# Patient Record
Sex: Female | Born: 1960 | Race: White | Hispanic: No | Marital: Married | Smoking: Never smoker
Health system: Southern US, Community
[De-identification: ages and names within clinical notes are randomized; demographics above are authoritative.]

## PROBLEM LIST (undated history)

## (undated) DIAGNOSIS — M549 Dorsalgia, unspecified: Secondary | ICD-10-CM

## (undated) DIAGNOSIS — M791 Myalgia, unspecified site: Secondary | ICD-10-CM

## (undated) DIAGNOSIS — K219 Gastro-esophageal reflux disease without esophagitis: Secondary | ICD-10-CM

## (undated) DIAGNOSIS — M609 Myositis, unspecified: Secondary | ICD-10-CM

## (undated) DIAGNOSIS — R51 Headache: Secondary | ICD-10-CM

## (undated) DIAGNOSIS — E559 Vitamin D deficiency, unspecified: Secondary | ICD-10-CM

## (undated) HISTORY — DX: Myalgia, unspecified site: M79.10

## (undated) HISTORY — PX: OTHER SURGICAL HISTORY: SHX169

## (undated) HISTORY — DX: Myositis, unspecified: M60.9

## (undated) HISTORY — PX: EYE SURGERY: SHX253

## (undated) HISTORY — PX: WISDOM TOOTH EXTRACTION: SHX21

## (undated) HISTORY — DX: Vitamin D deficiency, unspecified: E55.9

## (undated) HISTORY — DX: Gastro-esophageal reflux disease without esophagitis: K21.9

## (undated) HISTORY — DX: Dorsalgia, unspecified: M54.9

## (undated) HISTORY — PX: DILATION AND CURETTAGE OF UTERUS: SHX78

---

## 2002-04-15 ENCOUNTER — Other Ambulatory Visit: Admission: RE | Admit: 2002-04-15 | Discharge: 2002-04-15 | Payer: Self-pay | Admitting: Obstetrics and Gynecology

## 2003-06-09 ENCOUNTER — Other Ambulatory Visit: Admission: RE | Admit: 2003-06-09 | Discharge: 2003-06-09 | Payer: Self-pay | Admitting: Obstetrics and Gynecology

## 2003-07-27 ENCOUNTER — Encounter (INDEPENDENT_AMBULATORY_CARE_PROVIDER_SITE_OTHER): Payer: Self-pay

## 2003-07-27 ENCOUNTER — Ambulatory Visit (HOSPITAL_COMMUNITY): Admission: RE | Admit: 2003-07-27 | Discharge: 2003-07-27 | Payer: Self-pay | Admitting: Obstetrics and Gynecology

## 2004-06-02 ENCOUNTER — Other Ambulatory Visit: Admission: RE | Admit: 2004-06-02 | Discharge: 2004-06-02 | Payer: Self-pay | Admitting: Obstetrics and Gynecology

## 2005-06-26 ENCOUNTER — Other Ambulatory Visit: Admission: RE | Admit: 2005-06-26 | Discharge: 2005-06-26 | Payer: Self-pay | Admitting: Obstetrics and Gynecology

## 2005-09-13 ENCOUNTER — Encounter (INDEPENDENT_AMBULATORY_CARE_PROVIDER_SITE_OTHER): Payer: Self-pay | Admitting: Specialist

## 2005-09-13 ENCOUNTER — Ambulatory Visit (HOSPITAL_COMMUNITY): Admission: RE | Admit: 2005-09-13 | Discharge: 2005-09-13 | Payer: Self-pay | Admitting: Obstetrics and Gynecology

## 2005-12-06 ENCOUNTER — Encounter: Admission: RE | Admit: 2005-12-06 | Discharge: 2005-12-06 | Payer: Self-pay | Admitting: Emergency Medicine

## 2005-12-16 ENCOUNTER — Encounter: Admission: RE | Admit: 2005-12-16 | Discharge: 2005-12-16 | Payer: Self-pay | Admitting: Emergency Medicine

## 2006-02-01 ENCOUNTER — Encounter: Admission: RE | Admit: 2006-02-01 | Discharge: 2006-02-01 | Payer: Self-pay | Admitting: Neurosurgery

## 2006-03-10 IMAGING — RF DG MYELOGRAM LUMBAR
14 of 19 series · 14 of 19 positions shown · IV contrast (omnipaque)
Comparison: none

CLINICAL DATA: Left buttock and left lower extremity pain.  
 LUMBAR MYELOGRAM ([DATE] HOURS):
TECHNIQUE: The low back was prepped and draped in a sterile fashion.  Lidocaine was utilized for local anesthesia.  Under fluoroscopic guidance, a 22 gauge spinal needle was inserted into the CSF space at L2-3 via left paramedian approach.  20cc Omnipaque 180 was administered.  No complications were encountered.
TECHNIQUE: Multidetector CT imaging of the lumbar spine was performed after intrathecal injection of contrast.  Multiplanar CT image reconstructions were also generated.

[Series 1: (hospital) · 1 of 1 slices shown]
[im 1/1]
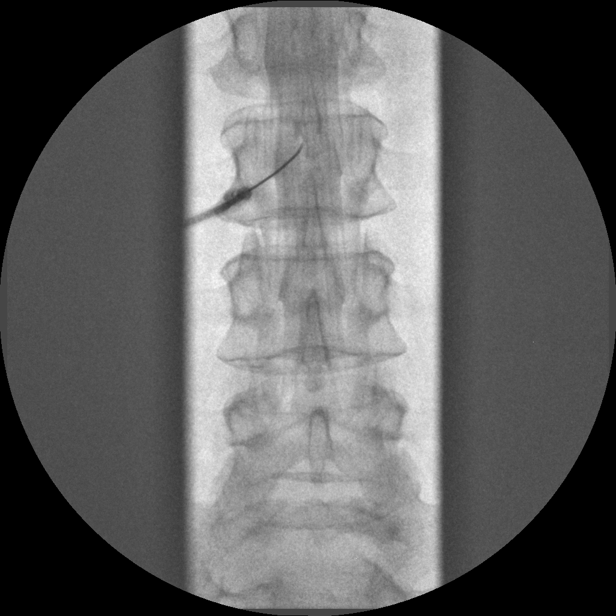

[Series 3: myelogram  white · 1 of 1 slices shown (1 of 13)]
[im 1/1]
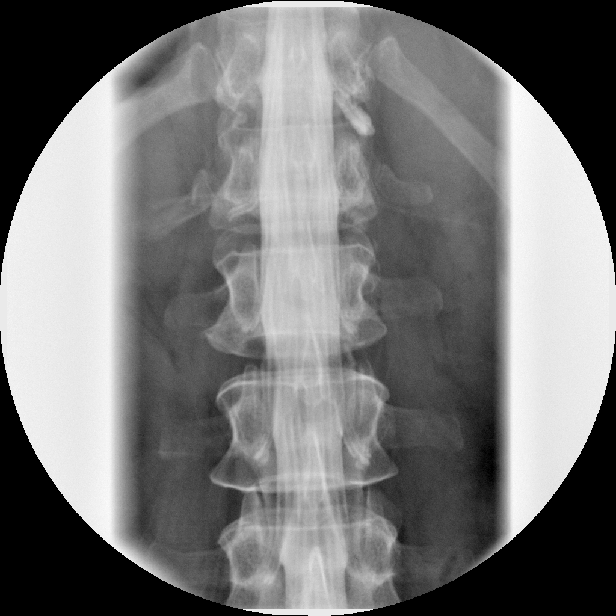

[Series 4: myelogram  white · 1 of 1 slices shown (2 of 13)]
[im 1/1]
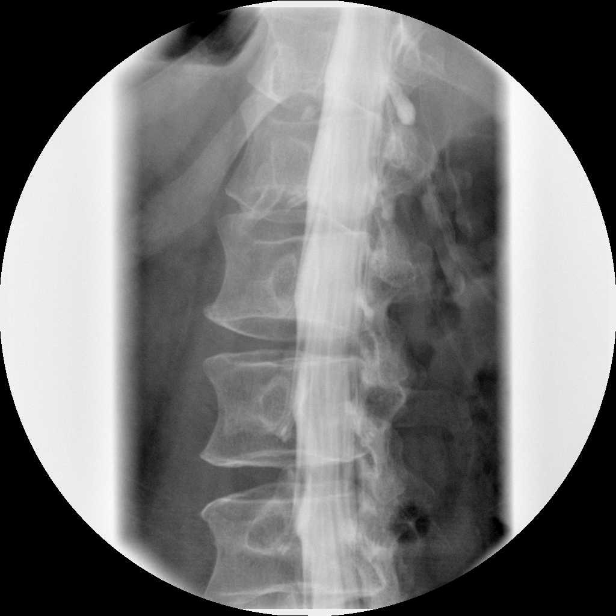

[Series 5: myelogram  white · 1 of 1 slices shown (3 of 13)]
[im 1/1]
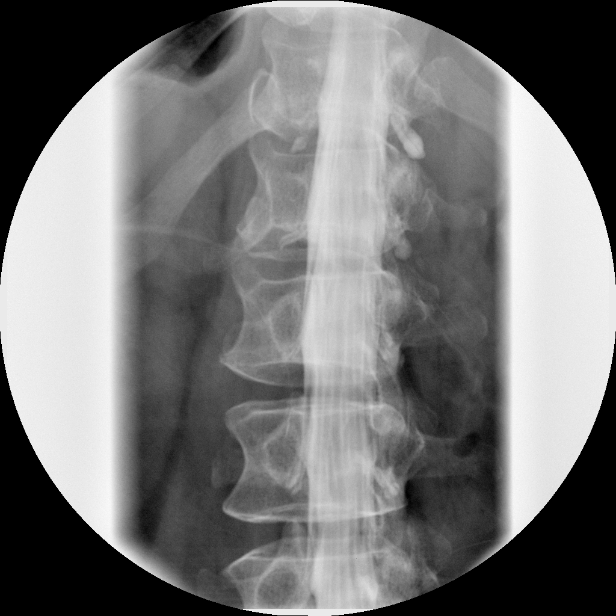

[Series 7: myelogram  white · 1 of 1 slices shown (4 of 13)]
[im 1/1]
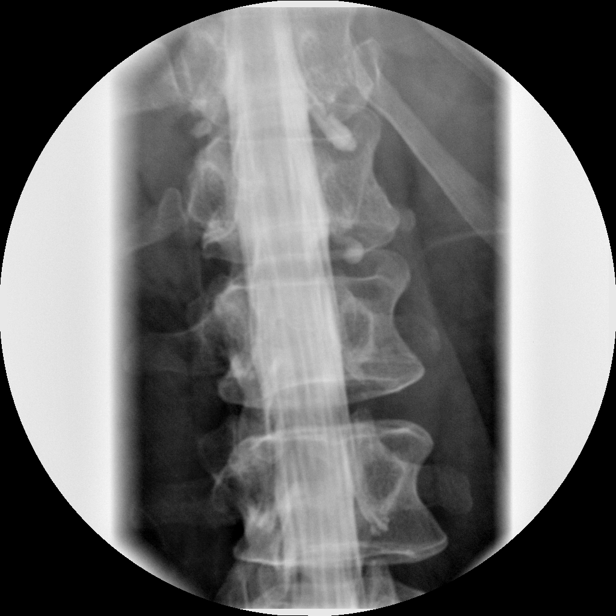

[Series 8: myelogram  white · 1 of 1 slices shown (5 of 13)]
[im 1/1]
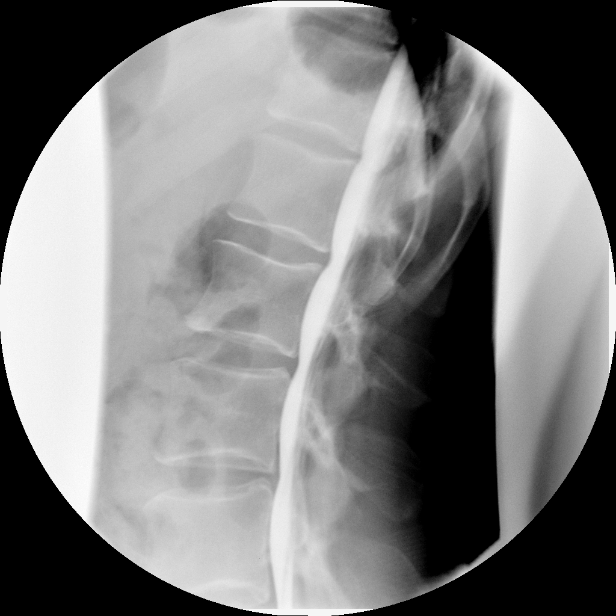

[Series 9: myelogram  white · 1 of 1 slices shown (6 of 13)]
[im 1/1]
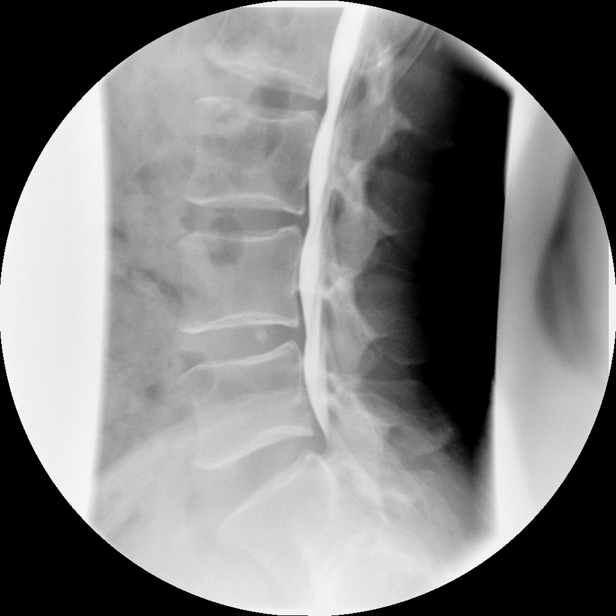

[Series 11: myelogram  white · 1 of 1 slices shown (7 of 13)]
[im 1/1]
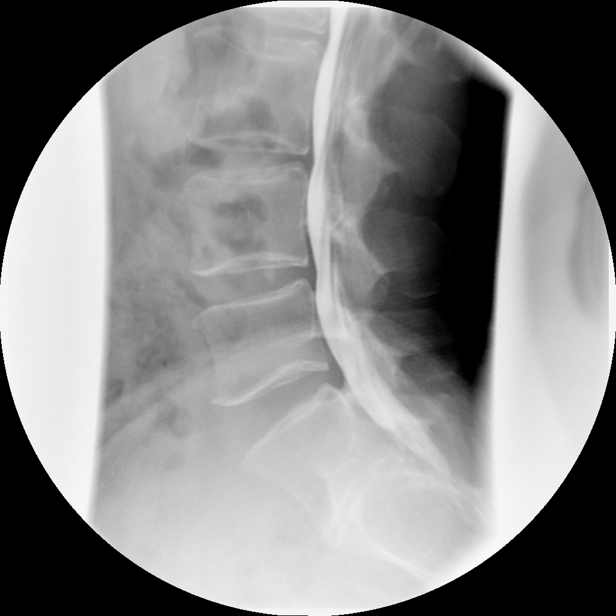

[Series 12: myelogram  white · 1 of 1 slices shown (8 of 13)]
[im 1/1]
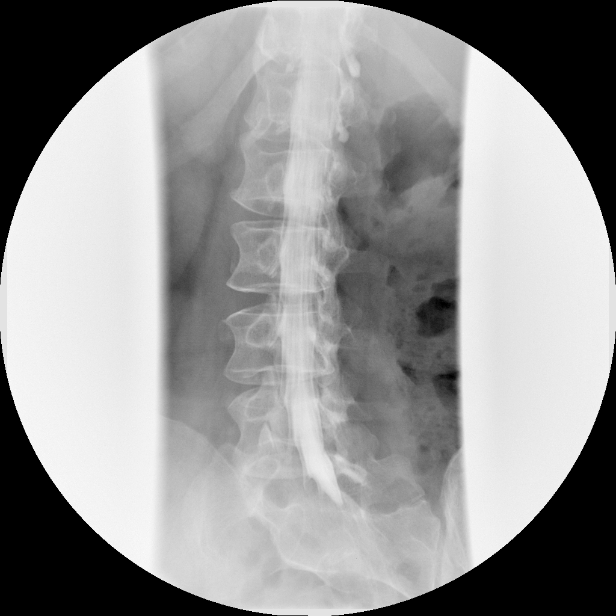

[Series 13: myelogram  white · 1 of 1 slices shown (9 of 13)]
[im 1/1]
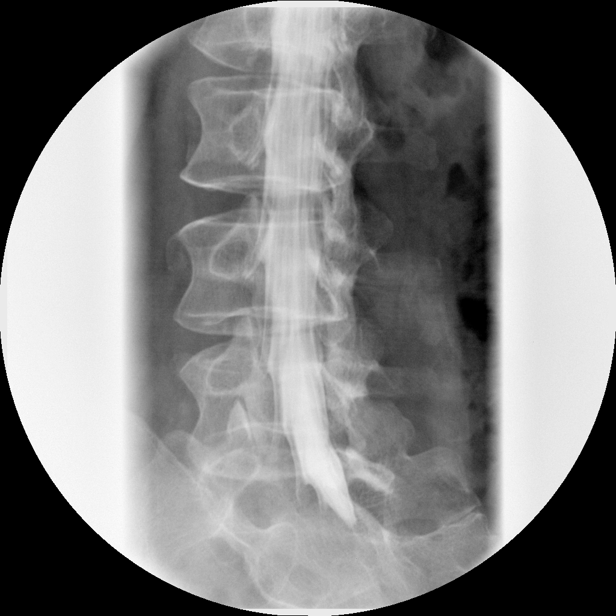

[Series 15: myelogram  white · 1 of 1 slices shown (10 of 13)]
[im 1/1]
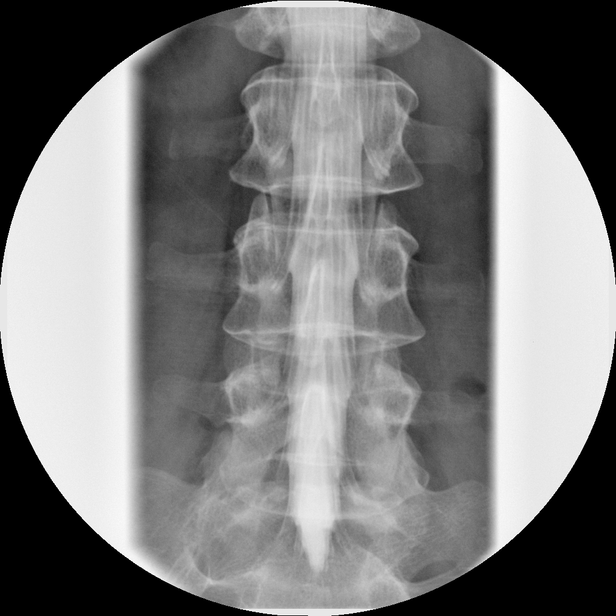

[Series 16: myelogram  white · 1 of 1 slices shown (11 of 13)]
[im 1/1]
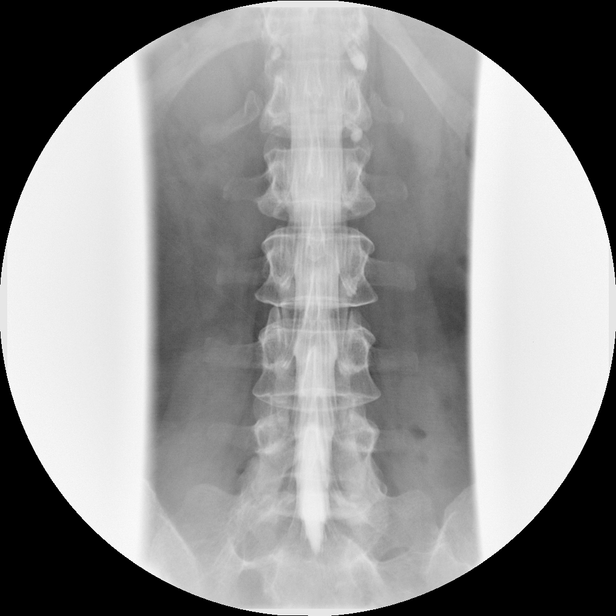

[Series 17: myelogram  white · 1 of 1 slices shown (12 of 13)]
[im 1/1]
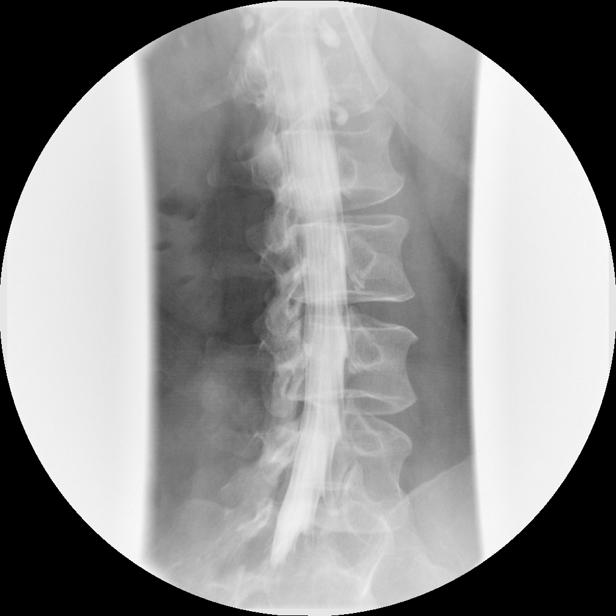

[Series 19: myelogram  white · 1 of 1 slices shown (13 of 13)]
[im 1/1]
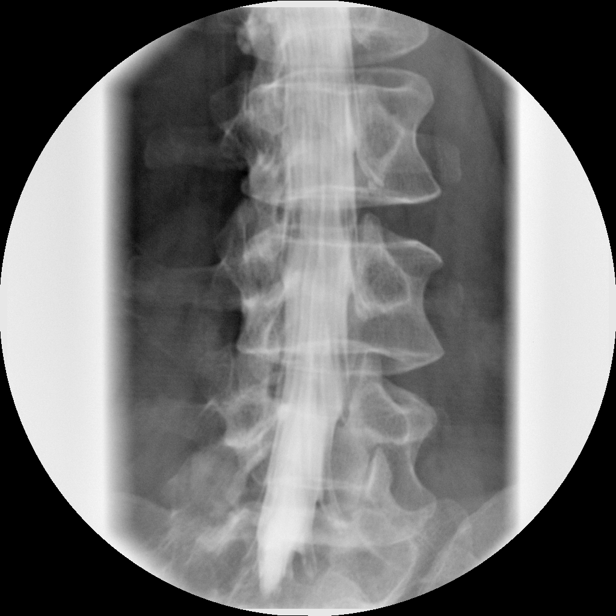

[14 of 19 positions shown; findings below may reference images not displayed]

FINDINGS: Intrathecal contrast is present.  Mild anterior extradural mass effect is present at L3-4 and L4-5.  Nerve root sleeves are contrast filled without evidence of effacement.
IMPRESSION: Lumbar myelogram in preparation for CT.  Mild extradural anterior mass effect at L3-4 and L4-5. 
 CT LUMBAR SPINE WITH CONTRAST (POST-MYELOGRAM) ([DATE] HOURS):
FINDINGS: Axial images are reconstructed in multiplanar format.  Transitional anatomy is again noted.  L-5 is sacralized and the L5-S1 disc is rudimentary.  Rudimentary ribs are present at T-12.  
 There is anatomic alignment of the vertebral bodies.  The conus medullaris terminates at the T12-L1 disc.  There is no vertebral body height loss.
 T12-L1, L1-2:  No disc herniation, canal stenosis, or foraminal stenosis.
 L2-3 and L3-4:  Mild concentric bulge without central lateral recess or foraminal stenosis.  No neural impingement.  There is a tiny bony spicule extending from the left side of the inferior L-3 end plate extending into the L3-4 disc of unknown significance. 
 L4-5:  Mild concentric bulge without central lateral recess or foraminal stenosis.  Negative neural impingement. 
 L5-S1:  No disc herniation, canal stenosis, or foraminal stenosis.  Failure of fusion of the posterior elements of L-5 is present.
IMPRESSION: 1.  Transitional anatomy is again present.  L-5 is sacralized.  The L5-S1 disc is rudimentary.  
 2.  Mild degenerative disc disease at L2-3, L3-4, and L4-5 without stenosis or neural impingement. 
 3.  Bony spicule in the L3-4 disc of unknown significance.

## 2006-03-10 IMAGING — CT CT L SPINE W/ CM
3 of 10 series · 13 of 33 positions shown, 16 images · IV contrast (omnipaque)
Comparison: none

CLINICAL DATA: Left buttock and left lower extremity pain.  
 LUMBAR MYELOGRAM ([DATE] HOURS):
TECHNIQUE: The low back was prepped and draped in a sterile fashion.  Lidocaine was utilized for local anesthesia.  Under fluoroscopic guidance, a 22 gauge spinal needle was inserted into the CSF space at L2-3 via left paramedian approach.  20cc Omnipaque 180 was administered.  No complications were encountered.
TECHNIQUE: Multidetector CT imaging of the lumbar spine was performed after intrathecal injection of contrast.  Multiplanar CT image reconstructions were also generated.

[Series 4: recon 3: l spine · axial · 0.27mm/px · z∈[-278,-135]mm · 5 of 338 slices shown, 7 images]
[im 57/338  soft-tissue]
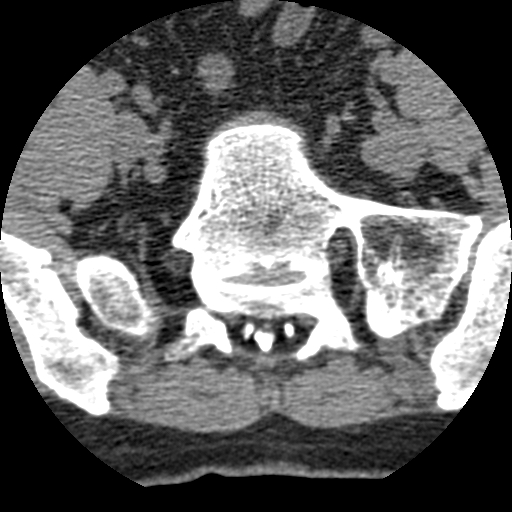
[im 57/338  bone]
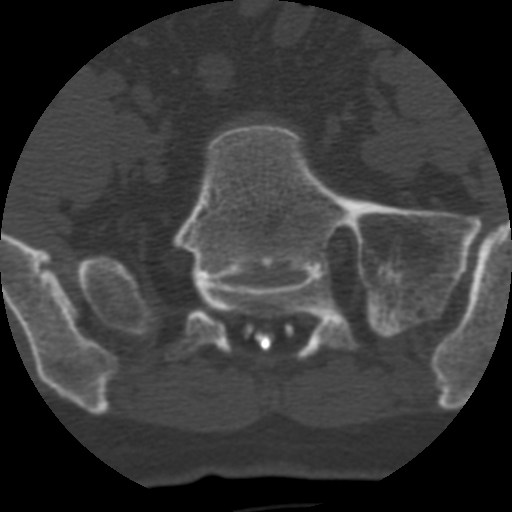
[im 113/338  bone]
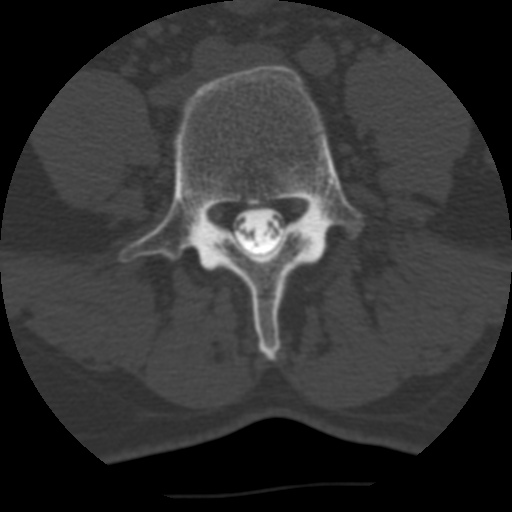
[im 169/338  bone]
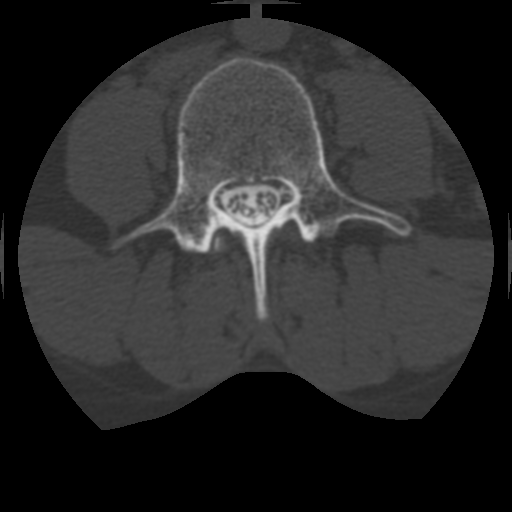
[im 225/338  bone]
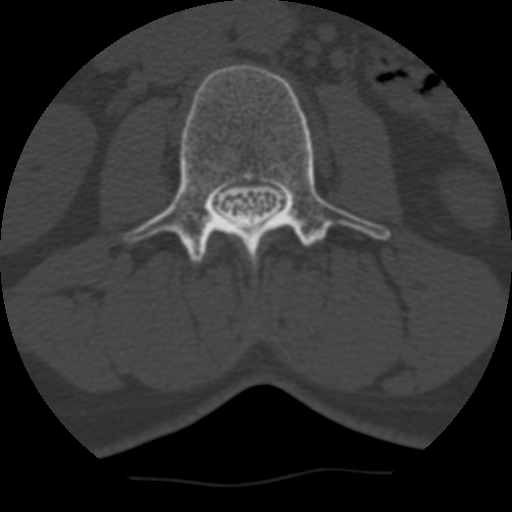
[im 281/338  soft-tissue]
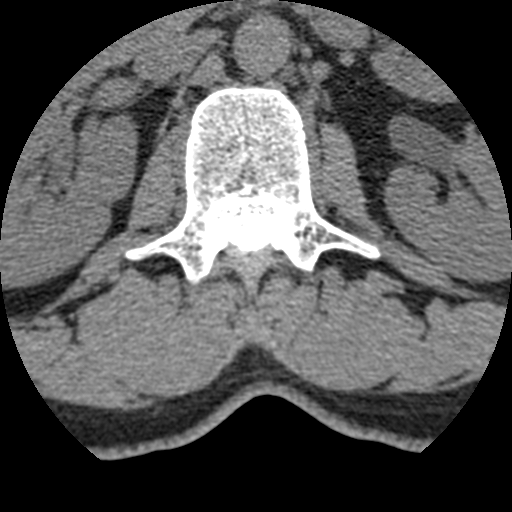
[im 281/338  bone]
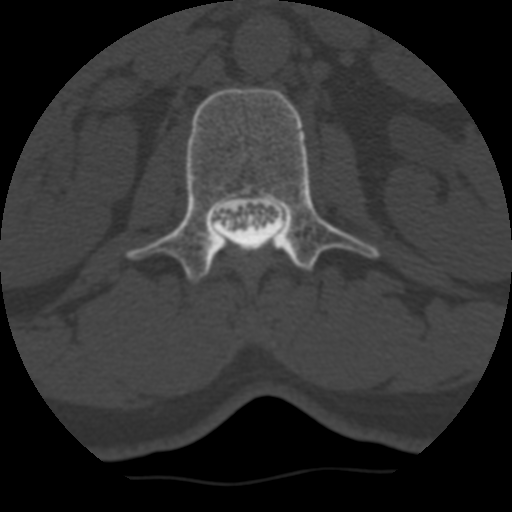

[Series 400: reformatted · sagittal · 0.42mm/px · 5 of 40 slices shown, 6 images (1 of 2)]
[im 14/40  bone]
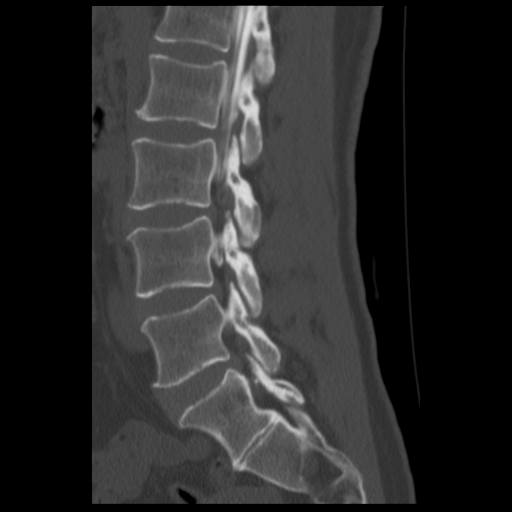
[im 17/40  bone]
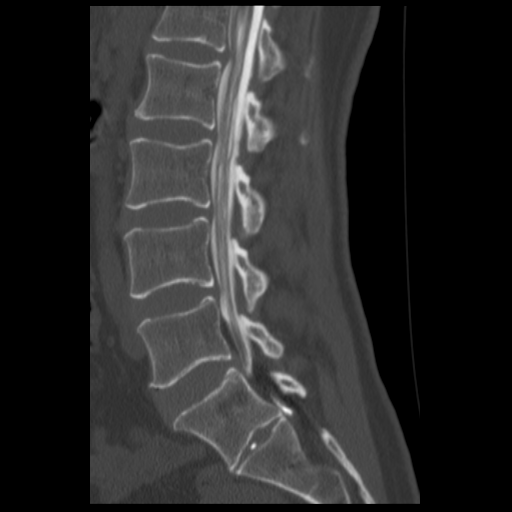
[im 20/40  soft-tissue]
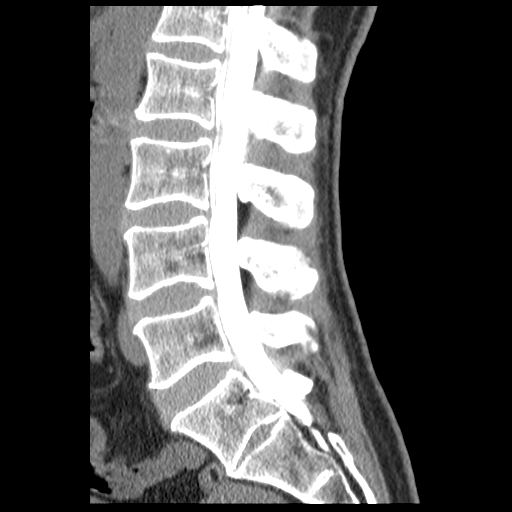
[im 20/40  bone]
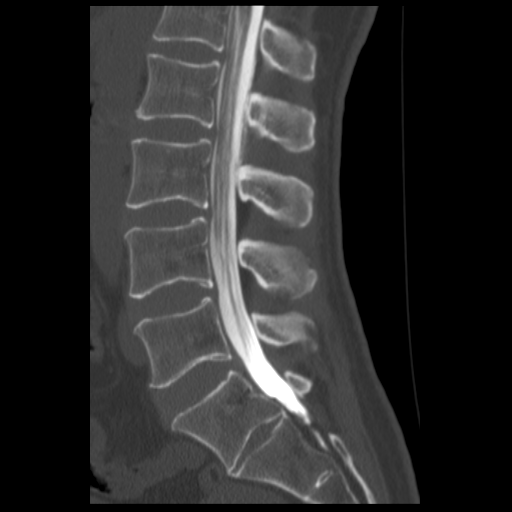
[im 23/40  bone]
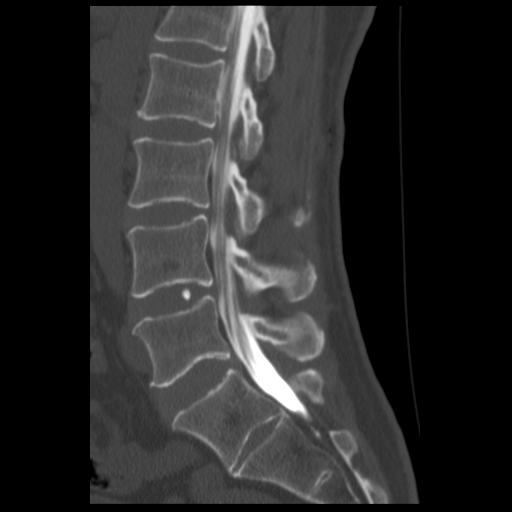
[im 27/40  bone]
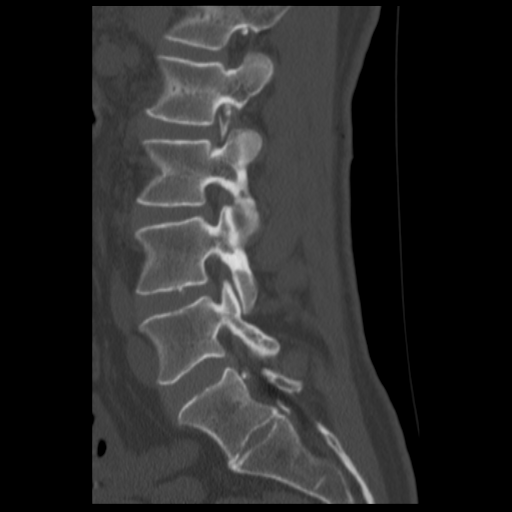

[Series 401: reformatted · coronal · 0.42mm/px · 3 of 40 slices shown (2 of 2)]
[im 8/40  bone]
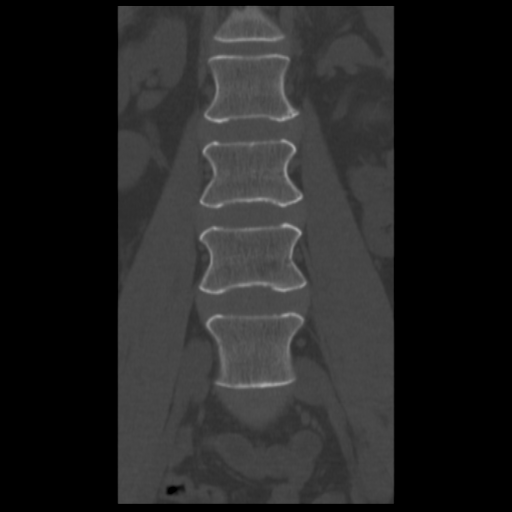
[im 16/40  bone]
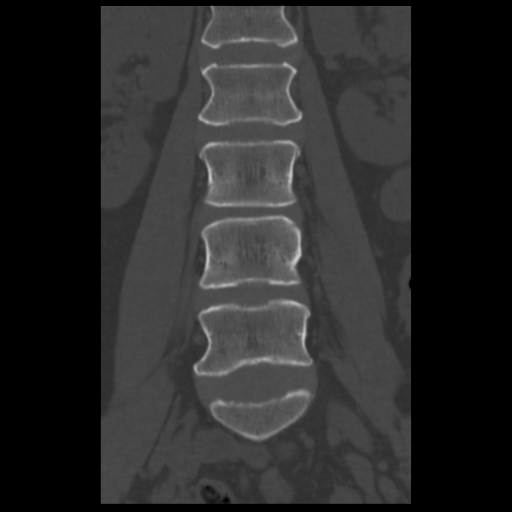
[im 24/40  bone]
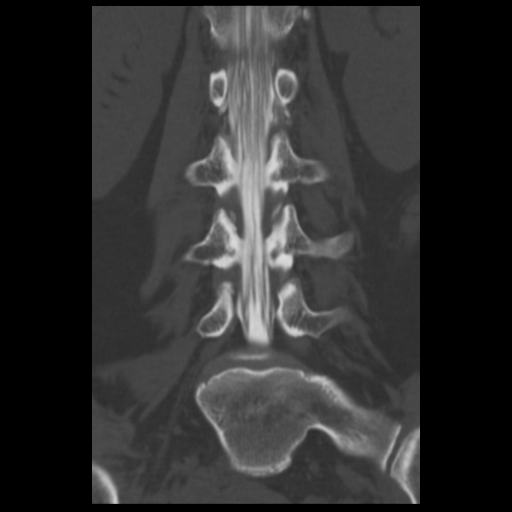

[13 of 33 positions shown; findings below may reference images not displayed]

FINDINGS: Intrathecal contrast is present.  Mild anterior extradural mass effect is present at L3-4 and L4-5.  Nerve root sleeves are contrast filled without evidence of effacement.
IMPRESSION: Lumbar myelogram in preparation for CT.  Mild extradural anterior mass effect at L3-4 and L4-5. 
 CT LUMBAR SPINE WITH CONTRAST (POST-MYELOGRAM) ([DATE] HOURS):
FINDINGS: Axial images are reconstructed in multiplanar format.  Transitional anatomy is again noted.  L-5 is sacralized and the L5-S1 disc is rudimentary.  Rudimentary ribs are present at T-12.  
 There is anatomic alignment of the vertebral bodies.  The conus medullaris terminates at the T12-L1 disc.  There is no vertebral body height loss.
 T12-L1, L1-2:  No disc herniation, canal stenosis, or foraminal stenosis.
 L2-3 and L3-4:  Mild concentric bulge without central lateral recess or foraminal stenosis.  No neural impingement.  There is a tiny bony spicule extending from the left side of the inferior L-3 end plate extending into the L3-4 disc of unknown significance. 
 L4-5:  Mild concentric bulge without central lateral recess or foraminal stenosis.  Negative neural impingement. 
 L5-S1:  No disc herniation, canal stenosis, or foraminal stenosis.  Failure of fusion of the posterior elements of L-5 is present.
IMPRESSION: 1.  Transitional anatomy is again present.  L-5 is sacralized.  The L5-S1 disc is rudimentary.  
 2.  Mild degenerative disc disease at L2-3, L3-4, and L4-5 without stenosis or neural impingement. 
 3.  Bony spicule in the L3-4 disc of unknown significance.

## 2009-10-21 ENCOUNTER — Encounter: Admission: RE | Admit: 2009-10-21 | Discharge: 2009-10-21 | Payer: Self-pay | Admitting: Sports Medicine

## 2009-10-31 ENCOUNTER — Encounter: Admission: RE | Admit: 2009-10-31 | Discharge: 2009-10-31 | Payer: Self-pay | Admitting: Sports Medicine

## 2009-11-14 ENCOUNTER — Encounter: Admission: RE | Admit: 2009-11-14 | Discharge: 2009-11-14 | Payer: Self-pay | Admitting: Sports Medicine

## 2011-04-27 NOTE — Op Note (Signed)
Kathleen Roman, Kathleen Roman             ACCOUNT NO.:  [id number]   MEDICAL RECORD NO.:  [id number]          PATIENT TYPE:  AMB   LOCATION:  [facility]                           FACILITY:  [facility]   PHYSICIAN:  Juluis Mire, M.D.   DATE OF BIRTH:  [birth year]   DATE OF PROCEDURE:  09/13/2005  DATE OF DISCHARGE:                                 OPERATIVE REPORT   PREOPERATIVE DIAGNOSIS:  Endometrial polyp.   POSTOPERATIVE DIAGNOSIS:  Endometrial polyp.   OPERATIVE PROCEDURE:  Hysteroscopy, resection of polyp, uterine curetting.   SURGEON:  Juluis Mire, M.D.   ANESTHESIA:  General.   ESTIMATED BLOOD LOSS:  Minimal.   PACKS AND DRAINS:  None.   INTRAOPERATIVE BLOOD REPLACED:  None.   COMPLICATIONS:  None.   INDICATIONS:  Dictated in the history and physical.   PROCEDURE:  The patient was taken to the OR and placed in the supine  position.  After a satisfactory level of general mask anesthesia obtained,  the patient was placed in the dorsal lithotomy position using the Allen  stirrups.  The perineum and vagina were cleansed out with Betadine and  draped in a sterile field.  A speculum was placed in the vaginal vault.  The  cervix was grasped with a single-tooth tenaculum.  The uterus sounded to 8  cm.  The cervix was serially dilated to a size 35 Pratt dilator.  Operative  hysteroscope was introduced.  Intrauterine cavity was distended using  sorbitol.  Visualization revealed a large polyp that was extending from the  uterine fundus into the uterine cavity.  This was resected in pieces.  This  was all sent for pathology.  At the end of the procedure the whole polyp had  been adequately resected.  There was no sign of perforation and no active  bleeding encountered.  Endometrial curettings were then obtained.  Single-  tooth tenaculum and speculum were then removed.  The patient was taken out  of the dorsal lithotomy position and once alert transferred to the recovery  room in good  condition.  Sponge, instrument and needle count reported as  correct by the circulating nurse.      Juluis Mire, M.D.  Electronically Signed    JSM/MEDQ  D:  09/13/2005  T:  09/13/2005  Job:  [id number]

## 2011-04-27 NOTE — H&P (Signed)
NAMECENIYA, FOWERS NO.:  [id number]   MEDICAL RECORD NO.:  [id number]          PATIENT TYPE:  AMB   LOCATION:                                FACILITY:  [facility]   PHYSICIAN:  Juluis Mire, M.D.   DATE OF BIRTH:  08/07/1961   DATE OF ADMISSION:  09/13/2005  DATE OF DISCHARGE:                                HISTORY & PHYSICAL   Patient is a 50 year old gravida 1, para 1 female who presents for a  hysteroscopic evaluation.   In relation to the present admission patient had had previous blood work  indicating menopausal status.  She had a resumption of her menstrual cycles.  A repeat FSH was not consistent with menopause.  We did a saline fusion  ultrasound that revealed a posterior wall uterine fibroid as well as a large  endometrial polyp.  She presents now for hysteroscopic evaluation and  resection of the endometrial polyp.   ALLERGIES:  Patient has no known drug allergies.   MEDICATIONS:  None.   PAST MEDICAL HISTORY:  Usual childhood diseases with no significant  sequelae.   PAST SURGICAL HISTORY:  She has had eye surgery.  She also in 2004 had a  LEEP excision of the cervix with laser ablation for management of cervical  dysplasia.   OBSTETRICAL HISTORY:  She has had one vaginal delivery.   FAMILY HISTORY:  Noncontributory.   SOCIAL HISTORY:  No tobacco or alcohol use.   REVIEW OF SYSTEMS:  Noncontributory.   PHYSICAL EXAMINATION:  VITAL SIGNS:  Patient is afebrile with stable vital  signs.  HEENT:  Patient normocephalic.  Pupils are equal, round, and reactive to  light and accommodation.  Extraocular movements were intact.  Sclerae and  conjunctivae clear.  Oropharynx clear.  NECK:  Without thyromegaly.  BREASTS:  No discrete masses.  LUNGS:  Clear.  CARDIOVASCULAR:  Regular rhythm, rate without murmurs or gallops.  ABDOMEN:  Benign.  No mass, organomegaly, or tenderness.  PELVIC:  Normal external genitalia.  Vaginal mucosa is clear.  Cervix  unremarkable.  Uterus normal size, shape, and contour.  Adnexa free of mass  or tenderness.  EXTREMITIES:  Trace edema.  NEUROLOGIC:  Grossly within normal limits.   IMPRESSION:  Perimenopausal bleeding pattern with endometrial polyp.   PLAN:  The patient will undergo hysteroscopic evaluation, resection of  polyp.  Risks of surgery have been discussed including the risk of  infection, risk of hemorrhage that could require transfusion, risk of AIDS  or hepatitis, risk of injury to adjacent organs through perforation that  could require exploratory surgery and possible hysterectomy, the risk of  deep venous thrombosis and pulmonary embolus.  Patient expressed  understanding of indications and risks.      Juluis Mire, M.D.  Electronically Signed     JSM/MEDQ  D:  09/13/2005  T:  09/13/2005  Job:  [id number]

## 2011-04-27 NOTE — H&P (Signed)
NAME:  Kathleen Roman, Kathleen Roman                       ACCOUNT NO.:  [id number]   MEDICAL RECORD NO.:  [id number]                   PATIENT TYPE:  AMB   LOCATION:  [facility]                                  FACILITY:  [facility]   PHYSICIAN:  Juluis Mire, M.D.                DATE OF BIRTH:  March 14, 1961   DATE OF ADMISSION:  07/27/2003  DATE OF DISCHARGE:                                HISTORY & PHYSICAL   HISTORY OF PRESENT ILLNESS:  The patient is a 50 year old gravida 1, para 1  married white female who presents for evaluation of abnormal cervical  cytology.   The patient had a routine gynecological exam and Pap smear performed on June 09, 2003.  Pap smear at that time revealed atypical squamous cells of  undetermined significance but could not exclude high grade dysplastic  changes.  It is of note that she had had a previous history of cervical  dysplasia in the past treated with Cryotherapy.  On colposcopy in the  office, colposcopy was not satisfactory.  There were abnormalities at the  level of the vaginal mucosa's reflexion onto the cervix posteriorly.  We did  biopsy this area and we did endocervical curettings.  Biopsy of the vaginal  area did reveal mild cervical dysplasia and endocervical curettings were  negative.  In view of the vaginal extension, the unsatisfactory colposcopy  and the possibility of high grade dysplasia by cytology, she now presents  for LEEP of the cervix which is loop electrical excision procedure as well  as laser ablation of the vaginal extension.   ALLERGIES:  CODEINE.   MEDICATIONS:  None.   PAST MEDICAL HISTORY:  Usual childhood diseases without any significant  sequelae.   OB HISTORY:  She has had one previous spontaneous vaginal delivery.   FAMILY HISTORY:  Noncontributory.   SOCIAL HISTORY:  No tobacco or alcohol use.   REVIEW OF SYSTEMS:  Noncontributory.   PHYSICAL EXAMINATION:  VITAL SIGNS:  Afebrile with stable vital signs.  HEENT:  The  patient is normocephalic.  Pupils equal, round and reactive to  light and accommodation.  Extraocular muscles intact.  Sclerae and  conjunctivae were clear.  Oropharynx clear.  NECK:  Without thyromegaly.  BREASTS:  No discrete masses.  LUNGS:  Clear.  CARDIOVASCULAR:  Regular rate without murmurs or gallops.  ABDOMEN:  Benign.  PELVIC:  Normal external genitalia.  Vaginal mucosa is clear.  Cervix  appears normal.  Uterus normal size, shape and contour.  Adnexa free of  masses or tenderness.  EXTREMITIES:  Trace edema.  NEUROLOGICAL:  Grossly within normal limits.    IMPRESSION:  1. Abnormal cervical cytology suggestive of high grade dysplasia.  2. Vaginal extension with mild dysplasia.   PLAN:  The patient will undergo loop electrical excision procedure of the  cervix and laser ablation of the vulvar area.  Risks of surgery have been  discussed including  the risk of bleeding that could require further  treatment or transfusion.  The risk of infection and the risk of damage to  the cervix that could lead to an incompetent cervix or cervical stenosis.  The patient understands the indication and risks.  She also understands the  potential for persistence or recurrence of dysplasia and need for close  follow up.                                               Juluis Mire, M.D.    JSM/MEDQ  D:  07/27/2003  T:  07/27/2003  Job:  [id number]

## 2011-04-27 NOTE — Op Note (Signed)
   NAME:  Kathleen Roman, Kathleen Roman                       ACCOUNT NO.:  [id number]   MEDICAL RECORD NO.:  [id number]                   PATIENT TYPE:  AMB   LOCATION:  [facility]                                  FACILITY:  [facility]   PHYSICIAN:  Juluis Mire, M.D.                DATE OF BIRTH:  1961-04-20   DATE OF PROCEDURE:  07/27/2003  DATE OF DISCHARGE:                                 OPERATIVE REPORT   PREOPERATIVE DIAGNOSIS:  Cervical dysplasia with vaginal extension.   POSTOPERATIVE DIAGNOSIS:  Cervical dysplasia with vaginal extension.   OPERATIVE PROCEDURE:  Loop electrical excision procedure of the cervix.  Subsequent CO2 laser ablation, vaginal extension of dysplasia.   SURGEON:  Juluis Mire, M.D.   ANESTHESIA:  General.   ESTIMATED BLOOD LOSS:  Minimal.   PACKS AND DRAINS:  None.   INTRAOPERATIVE BLOOD REPLACEMENT:  None.   COMPLICATIONS:  None.   INDICATIONS:  Dictated in the history and physical.   DESCRIPTION OF PROCEDURE:  The patient was taken to the OR and placed in  supine position.  After satisfactory level of general anesthesia obtained,  the patient was placed in the dorsal lithotomy position using the Allen  stirrups.  The patient was draped out as a sterile field with moist towels.  A laser speculum was placed in the vaginal vault, the cervix and vagina were  cleansed with Betadine.  The cervix was instilled with a solution of 1%  Xylocaine with epinephrine.  Using a white loop, a wide but shallow cone was  obtained of the cervix and sent for pathological review.   Next, using the CO2 laser at 20 watts continuous mode, the vaginal extension  of the dysplasia on the posterior vaginal wall was ablated down to  appropriate depth.  We then put Moncell's on the cervix; we had good  hemostasis.  The patient's speculum was then removed.  The patient was taken  out of the dorsal lithotomy position.  Once alert and extubated, transferred  to the recovery room in good  condition.  Sponge, needle and instrument count  were reported as correct by surgical nurse.                                               Juluis Mire, M.D.    JSM/MEDQ  D:  07/27/2003  T:  07/28/2003  Job:  [id number]

## 2011-05-14 ENCOUNTER — Emergency Department (HOSPITAL_COMMUNITY)
Admission: EM | Admit: 2011-05-14 | Discharge: 2011-05-14 | Disposition: A | Discharge status: Home / Self Care | Payer: 59 | Attending: Emergency Medicine | Admitting: Emergency Medicine

## 2011-05-14 DIAGNOSIS — Z91010 Allergy to peanuts: Secondary | ICD-10-CM | POA: Insufficient documentation

## 2011-05-14 DIAGNOSIS — T781XXA Other adverse food reactions, not elsewhere classified, initial encounter: Secondary | ICD-10-CM | POA: Insufficient documentation

## 2011-05-14 DIAGNOSIS — X58XXXA Exposure to other specified factors, initial encounter: Secondary | ICD-10-CM | POA: Insufficient documentation

## 2011-05-14 DIAGNOSIS — R6889 Other general symptoms and signs: Secondary | ICD-10-CM | POA: Insufficient documentation

## 2011-05-20 ENCOUNTER — Emergency Department (HOSPITAL_COMMUNITY)
Admission: EM | Admit: 2011-05-20 | Discharge: 2011-05-21 | Disposition: A | Discharge status: Home / Self Care | Payer: 59 | Attending: Emergency Medicine | Admitting: Emergency Medicine

## 2011-05-20 ENCOUNTER — Emergency Department (HOSPITAL_COMMUNITY): Payer: 59

## 2011-05-20 DIAGNOSIS — R079 Chest pain, unspecified: Secondary | ICD-10-CM | POA: Insufficient documentation

## 2011-05-20 DIAGNOSIS — R0602 Shortness of breath: Secondary | ICD-10-CM | POA: Insufficient documentation

## 2011-05-20 DIAGNOSIS — Z79899 Other long term (current) drug therapy: Secondary | ICD-10-CM | POA: Insufficient documentation

## 2011-05-20 LAB — COMPREHENSIVE METABOLIC PANEL
AST: 12 U/L (ref 0–37)
Albumin: 3.9 g/dL (ref 3.5–5.2)
Alkaline Phosphatase: 66 U/L (ref 39–117)
BUN: 13 mg/dL (ref 6–23)
CO2: 28 mEq/L (ref 19–32)
Chloride: 103 mEq/L (ref 96–112)
GFR calc non Af Amer: >60 mL/min (ref >60)
Potassium: 3.4 mEq/L — ABNORMAL LOW (ref 3.5–5.1)
Total Bilirubin: 0.6 mg/dL (ref 0.3–1.2)

## 2011-05-20 LAB — CK TOTAL AND CKMB (NOT AT ARMC)
Relative Index: INVALID (ref 0.0–2.5)
Relative Index: INVALID (ref 0.0–2.5)
Total CK: 54 U/L (ref 7–177)
Total CK: 47 U/L (ref 7–177)

## 2011-05-20 LAB — TROPONIN I: Troponin I: <0.3 ng/mL (ref <0.30)

## 2011-05-20 LAB — CBC
HCT: 35.9 % — ABNORMAL LOW (ref 36.0–46.0)
MCH: 31 pg (ref 26.0–34.0)
MCHC: 35.1 g/dL (ref 30.0–36.0)
MCV: 88.4 fL (ref 78.0–100.0)
RDW: 12.3 % (ref 11.5–15.5)
WBC: 8.4 10*3/uL (ref 4.0–10.5)

## 2011-05-20 LAB — D-DIMER, QUANTITATIVE: D-Dimer, Quant: <0.22 ug/mL-FEU (ref 0.00–0.48)

## 2011-05-21 ENCOUNTER — Emergency Department (HOSPITAL_COMMUNITY): Payer: 59

## 2011-05-21 MED ORDER — IOHEXOL 350 MG/ML SOLN
80.0000 mL | Freq: Once | INTRAVENOUS | Status: AC | PRN
  Administered 2011-05-21: 80 mL via INTRAVENOUS

## 2013-08-31 ENCOUNTER — Other Ambulatory Visit: Payer: Self-pay | Admitting: Internal Medicine

## 2013-08-31 DIAGNOSIS — R1013 Epigastric pain: Secondary | ICD-10-CM

## 2013-08-31 DIAGNOSIS — R110 Nausea: Secondary | ICD-10-CM

## 2013-09-01 ENCOUNTER — Ambulatory Visit
Admission: RE | Admit: 2013-09-01 | Discharge: 2013-09-01 | Disposition: A | Discharge status: Home / Self Care | Payer: 59 | Source: Ambulatory Visit | Attending: Internal Medicine | Admitting: Internal Medicine

## 2013-09-01 DIAGNOSIS — R110 Nausea: Secondary | ICD-10-CM

## 2013-09-01 DIAGNOSIS — R1013 Epigastric pain: Secondary | ICD-10-CM

## 2013-09-02 ENCOUNTER — Other Ambulatory Visit: Payer: Self-pay | Admitting: Gastroenterology

## 2013-09-02 DIAGNOSIS — R109 Unspecified abdominal pain: Secondary | ICD-10-CM

## 2013-09-02 DIAGNOSIS — R1310 Dysphagia, unspecified: Secondary | ICD-10-CM

## 2013-09-02 DIAGNOSIS — R110 Nausea: Secondary | ICD-10-CM

## 2013-09-03 ENCOUNTER — Ambulatory Visit
Admission: RE | Admit: 2013-09-03 | Discharge: 2013-09-03 | Disposition: A | Discharge status: Home / Self Care | Payer: 59 | Source: Ambulatory Visit | Attending: Gastroenterology | Admitting: Gastroenterology

## 2013-09-03 DIAGNOSIS — R1310 Dysphagia, unspecified: Secondary | ICD-10-CM

## 2013-09-03 DIAGNOSIS — R110 Nausea: Secondary | ICD-10-CM

## 2013-09-03 DIAGNOSIS — R109 Unspecified abdominal pain: Secondary | ICD-10-CM

## 2013-09-07 ENCOUNTER — Other Ambulatory Visit (HOSPITAL_COMMUNITY): Payer: Self-pay | Admitting: Gastroenterology

## 2013-09-07 DIAGNOSIS — R110 Nausea: Secondary | ICD-10-CM

## 2013-09-08 ENCOUNTER — Other Ambulatory Visit: Payer: Self-pay | Admitting: Gastroenterology

## 2013-09-14 ENCOUNTER — Other Ambulatory Visit: Payer: Self-pay | Admitting: Gastroenterology

## 2013-09-14 ENCOUNTER — Encounter (HOSPITAL_COMMUNITY): Payer: Self-pay

## 2013-09-14 ENCOUNTER — Emergency Department (HOSPITAL_COMMUNITY)
Admission: EM | Admit: 2013-09-14 | Discharge: 2013-09-15 | Disposition: A | Discharge status: Home / Self Care | Payer: 59 | Attending: Emergency Medicine | Admitting: Emergency Medicine

## 2013-09-14 DIAGNOSIS — K220 Achalasia of cardia: Secondary | ICD-10-CM | POA: Insufficient documentation

## 2013-09-14 DIAGNOSIS — R1310 Dysphagia, unspecified: Secondary | ICD-10-CM

## 2013-09-14 DIAGNOSIS — Z885 Allergy status to narcotic agent status: Secondary | ICD-10-CM | POA: Insufficient documentation

## 2013-09-14 DIAGNOSIS — M549 Dorsalgia, unspecified: Secondary | ICD-10-CM | POA: Insufficient documentation

## 2013-09-14 DIAGNOSIS — Z79899 Other long term (current) drug therapy: Secondary | ICD-10-CM | POA: Insufficient documentation

## 2013-09-14 DIAGNOSIS — R072 Precordial pain: Secondary | ICD-10-CM | POA: Insufficient documentation

## 2013-09-14 DIAGNOSIS — R110 Nausea: Secondary | ICD-10-CM | POA: Insufficient documentation

## 2013-09-14 LAB — COMPREHENSIVE METABOLIC PANEL
ALT: 14 U/L (ref 0–35)
Alkaline Phosphatase: 61 U/L (ref 39–117)
BUN: 7 mg/dL (ref 6–23)
CO2: 26 mEq/L (ref 19–32)
Calcium: 9.6 mg/dL (ref 8.4–10.5)
GFR calc Af Amer: >90 mL/min (ref >90)
GFR calc non Af Amer: >90 mL/min (ref >90)
Glucose, Bld: 113 mg/dL — ABNORMAL HIGH (ref 70–99)
Sodium: 142 mEq/L (ref 135–145)
Total Protein: 7.9 g/dL (ref 6.0–8.3)

## 2013-09-14 LAB — CBC WITH DIFFERENTIAL/PLATELET
MCHC: 34.6 g/dL (ref 30.0–36.0)
Monocytes Absolute: 0.5 10*3/uL (ref 0.1–1.0)
Neutro Abs: 3.5 10*3/uL (ref 1.7–7.7)
Neutrophils Relative %: 55 % (ref 43–77)
RDW: 12.2 % (ref 11.5–15.5)

## 2013-09-14 LAB — LIPASE, BLOOD: Lipase: 33 U/L (ref 11–59)

## 2013-09-14 NOTE — ED Notes (Signed)
Pt c/o RUQ pain starting the beginning of September, pt also reports problems swallowing feel like food get hung in her throat, pt has had an endoscopy, blood work and [employer] completed. Pt is scheduled swallow test tomorrow and gall bladder scan on Wednesday. Pt reports her pain is also in her back, unable to eat anything but is able to drink water and Gatorade, increase pain after eating

## 2013-09-15 ENCOUNTER — Ambulatory Visit
Admission: RE | Admit: 2013-09-15 | Discharge: 2013-09-15 | Disposition: A | Discharge status: Home / Self Care | Payer: 59 | Source: Ambulatory Visit | Attending: Gastroenterology | Admitting: Gastroenterology

## 2013-09-15 DIAGNOSIS — R1310 Dysphagia, unspecified: Secondary | ICD-10-CM

## 2013-09-15 LAB — POCT I-STAT TROPONIN I: Troponin i, poc: 0 ng/mL (ref 0.00–0.08)

## 2013-09-15 MED ORDER — SODIUM CHLORIDE 0.9 % IV BOLUS (SEPSIS)
1000.0000 mL | Freq: Once | INTRAVENOUS | Status: AC
  Administered 2013-09-15: 1000 mL via INTRAVENOUS

## 2013-09-15 MED ORDER — NITROGLYCERIN 0.4 MG SL SUBL: 0.4000 mg | SUBLINGUAL_TABLET | Freq: Three times a day (TID) | SUBLINGUAL | Status: DC | PRN

## 2013-09-15 NOTE — ED Notes (Signed)
MD at bedside. 

## 2013-09-15 NOTE — ED Provider Notes (Signed)
CSN: [id number]     Arrival date & time 09/14/13  2014 History   First MD Initiated Contact with Patient 09/15/13 0010     Chief Complaint  Patient presents with  . Abdominal Pain   (Consider location/radiation/quality/duration/timing/severity/associated sxs/prior Treatment) HPI Patient presents with 2 months of pain when she swallows and the sensation of food getting stuck in her throat. She has been seen by [facility] and has had multiple outpatient tests performed including endoscopy. She is scheduled for a swallow study today. She presents because of pain that she had at 6 PM when she tried to eat. The pain was substernal and radiated to her back. She states that she can drink Gatorade without any pain. She feels like she is getting dehydrated. She denies any dizziness or lightheadedness. She currently has no chest pain or shortness of breath. She denies any abdominal pain. History reviewed. No pertinent past medical history. Past Surgical History  Procedure Laterality Date  . Eye surgery     History reviewed. No pertinent family history. History  Substance Use Topics  . Smoking status: Never Smoker   . Smokeless tobacco: Not on file  . Alcohol Use: No   OB History   Grav Para Term Preterm Abortions TAB SAB Ect Mult Living                 Review of Systems  Constitutional: Negative for fever and chills.  Respiratory: Negative for cough and shortness of breath.   Cardiovascular: Positive for chest pain. Negative for palpitations and leg swelling.  Gastrointestinal: Positive for nausea and abdominal pain. Negative for vomiting, diarrhea and constipation.  Musculoskeletal: Positive for back pain. Negative for myalgias and arthralgias.  Neurological: Negative for dizziness, weakness, light-headedness, numbness and headaches.  All other systems reviewed and are negative.    Allergies  Darvocet and Peanuts  Home Medications   Current Outpatient Rx  Name  Route   Sig  Dispense  Refill  . esomeprazole (NEXIUM) 40 MG capsule   Oral   Take 40 mg by mouth daily before breakfast.         . nitroGLYCERIN (NITROSTAT) 0.4 MG SL tablet   Sublingual   Place 1 tablet (0.4 mg total) under the tongue 3 (three) times daily as needed (esophageal pain).   30 tablet   0    BP 104/64  Pulse 76  Temp(Src) 98.6 F (37 C) (Oral)  Resp 16  Ht 5\' 1"  (1.549 m)  Wt 114 lb (51.71 kg)  BMI 21.55 kg/m2  SpO2 100% Physical Exam  Nursing note and vitals reviewed. Constitutional: She is oriented to person, place, and time. She appears well-developed and well-nourished. No distress.  HENT:  Head: Normocephalic and atraumatic.  Mouth/Throat: Oropharynx is clear and moist.  Eyes: EOM are normal. Pupils are equal, round, and reactive to light.  Neck: Normal range of motion. Neck supple.  Cardiovascular: Normal rate and regular rhythm.   Pulmonary/Chest: Effort normal and breath sounds normal. No respiratory distress. She has no wheezes. She has no rales. She exhibits no tenderness.  Abdominal: Soft. Bowel sounds are normal. She exhibits no distension and no mass. There is no tenderness. There is no rebound and no guarding.  Musculoskeletal: Normal range of motion. She exhibits no edema and no tenderness.  tenderness or swelling.  Neurological: She is alert and oriented to person, place, and time.  Patient is alert and oriented x3 with clear, goal oriented speech. Patient has 5/5 motor in  all extremities. Sensation is intact to light touch.   Skin: Skin is warm and dry. No rash noted. No erythema.  Psychiatric: She has a normal mood and affect. Her behavior is normal.    ED Course  Procedures (including critical care time) Labs Review Labs Reviewed  COMPREHENSIVE METABOLIC PANEL - Abnormal; Notable for the following:    Glucose, Bld 113 (*)    All other components within normal limits  CBC WITH DIFFERENTIAL  LIPASE, BLOOD  POCT I-STAT TROPONIN I   Imaging  Review No results found.  Date: 09/15/2013  Rate: 84  Rhythm: normal sinus rhythm  QRS Axis: normal  Intervals: normal  ST/T Wave abnormalities: normal  Conduction Disutrbances:none  Narrative Interpretation:   Old EKG Reviewed: none available Occasional PVCs.  MDM   1. Achalasia of esophagus    Symptoms seem concerned concerning for achalasia/esophageal spasms. She's been encouraged to followup in the swallow study performed today. She was screen for coronary artery disease though I think is very unlikely. EKG with no acute findings normal troponin. She is feeling much better after the IV fluids. She is resting comfortably. I have written prescription for nitroglycerin. I suggested the patient has pain is unremitting that she try a sublingual nitroglycerin. Patient has been given return precautions. We'll discharge home with husband.    Loren Racer, MD 09/15/13 [phone]

## 2013-09-15 NOTE — ED Notes (Signed)
Phlebotomy called and informed we need an I-stat troponin drawn on the pt.

## 2013-09-15 NOTE — ED Notes (Signed)
Pt reports she is suppose to have a swallow test completed today.

## 2013-09-16 ENCOUNTER — Encounter (HOSPITAL_COMMUNITY)
Admission: RE | Admit: 2013-09-16 | Discharge: 2013-09-16 | Disposition: A | Discharge status: Home / Self Care | Payer: 59 | Source: Ambulatory Visit | Attending: Gastroenterology | Admitting: Gastroenterology

## 2013-09-16 DIAGNOSIS — R110 Nausea: Secondary | ICD-10-CM | POA: Insufficient documentation

## 2013-09-16 MED ORDER — SINCALIDE 5 MCG IJ SOLR
0.0200 ug/kg | Freq: Once | INTRAMUSCULAR | Status: AC
  Administered 2013-09-16: 1.04 ug via INTRAVENOUS

## 2013-09-16 MED ORDER — SINCALIDE 5 MCG IJ SOLR
INTRAMUSCULAR | Status: AC
  Administered 2013-09-16: 1.04 ug via INTRAVENOUS
  Filled 2013-09-16: qty 5

## 2013-09-16 MED ORDER — TECHNETIUM TC 99M MEBROFENIN IV KIT
5.0000 | PACK | Freq: Once | INTRAVENOUS | Status: AC | PRN
  Administered 2013-09-16: 5 via INTRAVENOUS

## 2013-09-18 ENCOUNTER — Encounter (HOSPITAL_COMMUNITY): Payer: 59

## 2013-09-19 DIAGNOSIS — Z79899 Other long term (current) drug therapy: Secondary | ICD-10-CM | POA: Insufficient documentation

## 2013-09-19 DIAGNOSIS — E860 Dehydration: Secondary | ICD-10-CM | POA: Insufficient documentation

## 2013-09-19 DIAGNOSIS — R5381 Other malaise: Secondary | ICD-10-CM | POA: Insufficient documentation

## 2013-09-19 DIAGNOSIS — R1310 Dysphagia, unspecified: Secondary | ICD-10-CM | POA: Insufficient documentation

## 2013-09-19 NOTE — ED Notes (Signed)
Pt c/o right upper abdominal pain, States she has not been able eat and she has been drinking Pedialyte.

## 2013-09-20 ENCOUNTER — Emergency Department (HOSPITAL_COMMUNITY)
Admission: EM | Admit: 2013-09-20 | Discharge: 2013-09-20 | Disposition: A | Discharge status: Home / Self Care | Payer: 59 | Attending: Emergency Medicine | Admitting: Emergency Medicine

## 2013-09-20 DIAGNOSIS — R1310 Dysphagia, unspecified: Secondary | ICD-10-CM

## 2013-09-20 DIAGNOSIS — E860 Dehydration: Secondary | ICD-10-CM

## 2013-09-20 LAB — URINALYSIS, ROUTINE W REFLEX MICROSCOPIC
Glucose, UA: NEGATIVE mg/dL
Leukocytes, UA: NEGATIVE
Protein, ur: NEGATIVE mg/dL
Urobilinogen, UA: 0.2 mg/dL (ref 0.0–1.0)
pH: 7 (ref 5.0–8.0)

## 2013-09-20 LAB — COMPREHENSIVE METABOLIC PANEL
AST: 31 U/L (ref 0–37)
Albumin: 4.2 g/dL (ref 3.5–5.2)
BUN: 4 mg/dL — ABNORMAL LOW (ref 6–23)
CO2: 26 mEq/L (ref 19–32)
Calcium: 9.3 mg/dL (ref 8.4–10.5)
Creatinine, Ser: 0.71 mg/dL (ref 0.50–1.10)
GFR calc non Af Amer: >90 mL/min (ref >90)
Sodium: 139 mEq/L (ref 135–145)
Total Protein: 7.1 g/dL (ref 6.0–8.3)

## 2013-09-20 LAB — CBC WITH DIFFERENTIAL/PLATELET
Basophils Absolute: 0 10*3/uL (ref 0.0–0.1)
Basophils Relative: 0 % (ref 0–1)
Eosinophils Absolute: 0.1 10*3/uL (ref 0.0–0.7)
Eosinophils Relative: 2 % (ref 0–5)
HCT: 37.4 % (ref 36.0–46.0)
Lymphs Abs: 1.7 10*3/uL (ref 0.7–4.0)
MCHC: 34.8 g/dL (ref 30.0–36.0)
MCV: 89.5 fL (ref 78.0–100.0)
Monocytes Absolute: 0.4 10*3/uL (ref 0.1–1.0)
Neutro Abs: 3.1 10*3/uL (ref 1.7–7.7)
Platelets: 154 10*3/uL (ref 150–400)
RBC: 4.18 MIL/uL (ref 3.87–5.11)
RDW: 12.1 % (ref 11.5–15.5)
WBC: 5.2 10*3/uL (ref 4.0–10.5)

## 2013-09-20 MED ORDER — NITROGLYCERIN 0.4 MG SL SUBL: 0.4000 mg | SUBLINGUAL_TABLET | SUBLINGUAL | Status: DC | PRN

## 2013-09-20 MED ORDER — LACTATED RINGERS IV BOLUS (SEPSIS)
1000.0000 mL | Freq: Once | INTRAVENOUS | Status: AC
  Administered 2013-09-20: 1000 mL via INTRAVENOUS

## 2013-09-20 NOTE — ED Provider Notes (Signed)
CSN: [id number]     Arrival date & time 09/19/13  2307 History   First MD Initiated Contact with Patient 09/20/13 0047     Chief Complaint  Patient presents with  . Abdominal Pain   (Consider location/radiation/quality/duration/timing/severity/associated sxs/prior Treatment) HPI Comments: Patient with no medical hx presents with difficulty swallowing. She has been seen by gastroenterology and has had multiple outpatient tests performed including endoscopy, upper GI series, Gall bladder studies - and has a f.u with Surgery and [facility] set up. She feels like she is getting dehydrated. She denies any dizziness or lightheadedness. She currently has no chest pain or shortness of breath. She denies any abdominal pain. Pt is taking pedialyte. With solids, it feels like food is getting stuck in her throat.   Patient is a 52 y.o. female presenting with abdominal pain. The history is provided by the patient, medical records and the spouse.  Abdominal Pain Associated symptoms: fatigue   Associated symptoms: no chest pain, no dysuria, no nausea, no shortness of breath and no vomiting     No past medical history on file. Past Surgical History  Procedure Laterality Date  . Eye surgery     No family history on file. History  Substance Use Topics  . Smoking status: Never Smoker   . Smokeless tobacco: Not on file  . Alcohol Use: No   OB History   Grav Para Term Preterm Abortions TAB SAB Ect Mult Living                 Review of Systems  Constitutional: Positive for activity change and fatigue.  Respiratory: Negative for shortness of breath.   Cardiovascular: Negative for chest pain.  Gastrointestinal: Positive for abdominal pain. Negative for nausea and vomiting.  Genitourinary: Negative for dysuria.  Musculoskeletal: Negative for neck pain.  Neurological: Positive for weakness. Negative for dizziness, syncope and headaches.    Allergies  Peanuts  Home Medications   Current Outpatient  Rx  Name  Route  Sig  Dispense  Refill  . esomeprazole (NEXIUM) 40 MG capsule   Oral   Take 40 mg by mouth 2 (two) times daily before a meal.          BP 105/64  Pulse 60  Temp(Src) 98.5 F (36.9 C) (Oral)  Resp 15  Ht 5\' 1"  (1.549 m)  Wt 112 lb (50.803 kg)  BMI 21.17 kg/m2  SpO2 98% Physical Exam  Nursing note and vitals reviewed. Constitutional: She is oriented to person, place, and time. She appears well-developed and well-nourished.  HENT:  Head: Normocephalic and atraumatic.  Eyes: EOM are normal. Pupils are equal, round, and reactive to light.  Neck: Neck supple.  Cardiovascular: Normal rate, regular rhythm and normal heart sounds.   No murmur heard. Pulmonary/Chest: Effort normal. No respiratory distress.  Abdominal: Soft. She exhibits no distension. There is no tenderness. There is no rebound and no guarding.  Neurological: She is alert and oriented to person, place, and time.  Skin: Skin is warm and dry.    ED Course  Procedures (including critical care time) Labs Review Labs Reviewed  COMPREHENSIVE METABOLIC PANEL - Abnormal; Notable for the following:    Glucose, Bld 104 (*)    BUN 4 (*)    ALT 47 (*)    All other components within normal limits  URINALYSIS, ROUTINE W REFLEX MICROSCOPIC - Abnormal; Notable for the following:    Ketones, ur 15 (*)    All other components within normal  limits  CBC WITH DIFFERENTIAL  LIPASE, BLOOD   Imaging Review No results found.  EKG Interpretation   None       MDM   1. Dysphagia   2. Dehydration    Pt comes in with cc of decreased po intake, + dysphagia and dehydration.  Labs indicate mild dehydration - fluids provided. She has no elye abn, and it seems like calorie wise, she is able to keep up with metabolic needs. Pt has outpatient work up that is on going, and is appropriate. Neuro-muscular condition possible as well - there is no hx of recent infection, or NM disorders in the family.  I think she is  getting appropriate workup for now, and we will not add neuro. Pt stable for discharge.   Derwood Kaplan, MD 09/20/13 [phone]

## 2013-09-20 NOTE — ED Notes (Signed)
Pt made aware a urine sample is needed. She is unable to go at this time.

## 2013-09-20 NOTE — ED Notes (Signed)
MD at bedside. Currently assessing patient. vss no c/os of pain

## 2013-09-21 ENCOUNTER — Encounter (INDEPENDENT_AMBULATORY_CARE_PROVIDER_SITE_OTHER): Payer: Self-pay | Admitting: Surgery

## 2013-09-21 ENCOUNTER — Ambulatory Visit (INDEPENDENT_AMBULATORY_CARE_PROVIDER_SITE_OTHER): Payer: 59 | Admitting: Surgery

## 2013-09-21 ENCOUNTER — Encounter (HOSPITAL_COMMUNITY): Payer: Self-pay | Admitting: Pharmacy Technician

## 2013-09-21 VITALS — BP 102/80 | HR 68 | Temp 99.1°F | Resp 14 | Ht 61.25 in | Wt 111.8 lb

## 2013-09-21 DIAGNOSIS — K828 Other specified diseases of gallbladder: Secondary | ICD-10-CM | POA: Insufficient documentation

## 2013-09-21 MED ORDER — ONDANSETRON HCL 4 MG/5ML PO SOLN: 4.0000 mg | Freq: Two times a day (BID) | ORAL | Status: DC | PRN

## 2013-09-21 NOTE — Addendum Note (Signed)
Addended by: Abigail Miyamoto A on: 09/21/2013 10:16 AM   Modules accepted: Orders

## 2013-09-21 NOTE — Progress Notes (Signed)
Patient ID: CLEMENTINE SOULLIERE, female   DOB: 1961-12-03, 52 y.o.   MRN: [id number]  Chief Complaint  Patient presents with  . New Evaluation    eval abnormal hida scan     HPI ROBERTO HLAVATY is a 52 y.o. female.   HPI This is a 52 year old female referred by Dr.Ganem and Outlaw for evaluation of biliary dyskinesia. She has been having significant gastrointestinal problems for the last 4 weeks. Her biggest complaint is difficulty swallowing and a sensation of food is held up. She will also have some nausea and emesis from time to time. She relates this to everything she needs. There is some mild discomfort in the epigastric area. She has had an extensive workup including esophageal study, an upper GI series, upper endoscopy, ultrasound, and HIDA scan.  She feels that she is dehydrated because of her inability to keep anything down. Past Medical History  Diagnosis Date  . Vitamin D deficiency disease   . Back pain   . Myalgia   . Myositis   . GERD (gastroesophageal reflux disease)     Past Surgical History  Procedure Laterality Date  . Eye surgery    . Colonscopy      History reviewed. No pertinent family history.  Social History History  Substance Use Topics  . Smoking status: Never Smoker   . Smokeless tobacco: Never Used  . Alcohol Use: No    Allergies  Allergen Reactions  . Darvocet [Propoxyphene-Acetaminophen]     hallucinations  . Hydrocodone-Acetaminophen   . Peanuts [Nuts] Other (See Comments)    All nuts. Shortness of breath    Current Outpatient Prescriptions  Medication Sig Dispense Refill  . EPINEPHrine (EPIPEN IJ) Inject as directed.      Marland Kitchen esomeprazole (NEXIUM) 40 MG capsule Take 40 mg by mouth 2 (two) times daily before a meal.      . cholecalciferol (VITAMIN D) 1000 UNITS tablet Take 1,000 Units by mouth daily.      Marland Kitchen guaiFENesin (MUCINEX) 600 MG 12 hr tablet Take 1,200 mg by mouth 2 (two) times daily.      . mometasone (NASONEX) 50 MCG/ACT nasal  spray Place 2 sprays into the nose daily.      . promethazine (PHENERGAN) 12.5 MG tablet        No current facility-administered medications for this visit.    Review of Systems Review of Systems  Constitutional: Negative for fever, chills and unexpected weight change.  HENT: Negative for congestion, hearing loss, sore throat, trouble swallowing and voice change.   Eyes: Negative for visual disturbance.  Respiratory: Negative for cough and wheezing.   Cardiovascular: Negative for chest pain, palpitations and leg swelling.  Gastrointestinal: Positive for nausea, vomiting and abdominal pain. Negative for diarrhea, constipation, blood in stool, abdominal distention and anal bleeding.  Genitourinary: Negative for hematuria, vaginal bleeding and difficulty urinating.  Musculoskeletal: Negative for arthralgias.  Skin: Negative for rash and wound.  Neurological: Negative for seizures, syncope and headaches.  Hematological: Negative for adenopathy. Does not bruise/bleed easily.  Psychiatric/Behavioral: Negative for confusion.    Blood pressure 102/80, pulse 68, temperature 99.1 F (37.3 C), temperature source Temporal, resp. rate 14, height 5' 1.25" (1.556 m), weight 111 lb 12.8 oz (50.712 kg).  Physical Exam Physical Exam  Constitutional: She is oriented to person, place, and time. She appears well-developed and well-nourished. No distress.  HENT:  Head: Normocephalic and atraumatic.  Right Ear: External ear normal.  Left Ear: External ear normal.  Nose: Nose normal.  Mouth/Throat: Oropharynx is clear and moist. No oropharyngeal exudate.  Eyes: Conjunctivae are normal. Pupils are equal, round, and reactive to light. Right eye exhibits no discharge. Left eye exhibits no discharge. No scleral icterus.  Neck: Neck supple. No tracheal deviation present.  Cardiovascular: Normal rate, regular rhythm, normal heart sounds and intact distal pulses.   No murmur heard. Pulmonary/Chest: Effort  normal and breath sounds normal. No respiratory distress. She has no wheezes.  Abdominal: Soft. Bowel sounds are normal. There is no tenderness. There is no rebound and no guarding.  Musculoskeletal: Normal range of motion. She exhibits no edema and no tenderness.  Lymphadenopathy:    She has no cervical adenopathy.  Neurological: She is alert and oriented to person, place, and time.  Skin: Skin is warm and dry. No rash noted. She is not diaphoretic. No erythema.  Psychiatric: Her behavior is normal.    Data Reviewed I have reviewed all her studies. Her ultrasound shows gallbladder crystals. Her HIDA scan shows a 5% gallbladder ejection fraction  Assessment    Biliary dyskinesia     Plan    I had a long discussion with the patient and her husband. She certainly has an indication to take out her gallbladder via a laparoscopic cholecystectomy given the results. It is difficult to tell whether this will resolve the esophageal symptoms. However, no other source of her symptoms can be identified despite the thorough workup. I discussed the surgery with her in detail. I discussed the risks which includes but is not limited to bleeding, infection, bile duct injury, bile leak, the need to convert to an open procedure, and the real possibility that this may not resolve her symptoms. I also discussed postoperative recovery and what to expect. After a long discussion, she wishes to proceed with a laparoscopic cholecystectomy. Surgery will be scheduled urgently        Parley Pidcock A 09/21/2013, 10:07 AM

## 2013-09-23 ENCOUNTER — Encounter (HOSPITAL_COMMUNITY)
Admission: RE | Admit: 2013-09-23 | Discharge: 2013-09-23 | Disposition: A | Discharge status: Home / Self Care | Payer: 59 | Source: Ambulatory Visit | Attending: Surgery | Admitting: Surgery

## 2013-09-23 ENCOUNTER — Encounter (HOSPITAL_COMMUNITY): Payer: Self-pay

## 2013-09-23 HISTORY — DX: Headache: R51

## 2013-09-23 MED ORDER — CEFAZOLIN SODIUM-DEXTROSE 2-3 GM-% IV SOLR
2.0000 g | INTRAVENOUS | Status: AC
  Administered 2013-09-24: 2 g via INTRAVENOUS
  Filled 2013-09-23: qty 50

## 2013-09-23 NOTE — Pre-Procedure Instructions (Signed)
Kathleen Roman  09/23/2013   Your procedure is scheduled on: Thursday, September 24, 2013  Report to [facility] Short Stay (use Main Entrance "A'') at 9:00 AM.  Call this number if you have problems the morning of surgery: [phone]   Remember:   Do not eat food or drink liquids after midnight.   Take these medicines the morning of surgery with A SIP OF WATER: esomeprazole (NEXIUM) 40 MG capsule, if needed: ondansetron (ZOFRAN) 4 MG/5ML solution for nausea, EPINEPHrine (EPIPEN IJ)   Do not wear jewelry, make-up or nail polish.  Do not wear lotions, powders, or perfumes. You may NOT wear deodorant.  Do not shave 48 hours prior to surgery.  Do not bring valuables to the hospital.  [facility] is not responsible for any belongings or valuables.               Contacts, dentures or bridgework may not be worn into surgery.  Leave suitcase in the car. After surgery it may be brought to your room.  For patients admitted to the hospital, discharge time is determined by your  treatment team.               Patients discharged the day of surgery will not be allowed to drive home.  Name and phone number of your driver:   Special Instructions: Shower using CHG 2 nights before surgery and the night before surgery.  If you shower the day of surgery use CHG.  Use special wash - you have one bottle of CHG for all showers.  You should use approximately 1/3 of the bottle for each shower.   Please read over the following fact sheets that you were given: Pain Booklet, Coughing and Deep Breathing and Surgical Site Infection Prevention

## 2013-09-23 NOTE — Progress Notes (Signed)
Pt denies SOB, chest pain, and being under the care of a cardiologist. Pt denies having a stress test, echo, and cardiac cath. 

## 2013-09-24 ENCOUNTER — Ambulatory Visit (HOSPITAL_COMMUNITY): Payer: 59 | Admitting: Anesthesiology

## 2013-09-24 ENCOUNTER — Encounter (HOSPITAL_COMMUNITY)
Admission: RE | Disposition: A | Discharge status: Home / Self Care | Payer: Self-pay | Source: Ambulatory Visit | Attending: Surgery

## 2013-09-24 ENCOUNTER — Ambulatory Visit (HOSPITAL_COMMUNITY)
Admission: RE | Admit: 2013-09-24 | Discharge: 2013-09-24 | Disposition: A | Discharge status: Home / Self Care | Payer: 59 | Source: Ambulatory Visit | Attending: Surgery | Admitting: Surgery

## 2013-09-24 ENCOUNTER — Encounter (HOSPITAL_COMMUNITY): Payer: 59 | Admitting: Anesthesiology

## 2013-09-24 ENCOUNTER — Encounter (HOSPITAL_COMMUNITY): Payer: Self-pay | Admitting: *Deleted

## 2013-09-24 DIAGNOSIS — K219 Gastro-esophageal reflux disease without esophagitis: Secondary | ICD-10-CM | POA: Insufficient documentation

## 2013-09-24 DIAGNOSIS — Z79899 Other long term (current) drug therapy: Secondary | ICD-10-CM | POA: Insufficient documentation

## 2013-09-24 DIAGNOSIS — K811 Chronic cholecystitis: Secondary | ICD-10-CM | POA: Insufficient documentation

## 2013-09-24 DIAGNOSIS — K449 Diaphragmatic hernia without obstruction or gangrene: Secondary | ICD-10-CM | POA: Insufficient documentation

## 2013-09-24 DIAGNOSIS — K824 Cholesterolosis of gallbladder: Secondary | ICD-10-CM

## 2013-09-24 DIAGNOSIS — E559 Vitamin D deficiency, unspecified: Secondary | ICD-10-CM | POA: Insufficient documentation

## 2013-09-24 HISTORY — PX: CHOLECYSTECTOMY: SHX55

## 2013-09-24 SURGERY — LAPAROSCOPIC CHOLECYSTECTOMY
Anesthesia: General | Site: Abdomen | Wound class: Clean Contaminated

## 2013-09-24 MED ORDER — SODIUM CHLORIDE 0.9 % IR SOLN
Status: DC | PRN
  Administered 2013-09-24: 1000 mL

## 2013-09-24 MED ORDER — ARTIFICIAL TEARS OP OINT
TOPICAL_OINTMENT | OPHTHALMIC | Status: DC | PRN
  Administered 2013-09-24: 1 via OPHTHALMIC

## 2013-09-24 MED ORDER — FENTANYL CITRATE 0.05 MG/ML IJ SOLN
25.0000 ug | INTRAMUSCULAR | Status: DC | PRN
  Administered 2013-09-24: 50 ug via INTRAVENOUS
  Administered 2013-09-24 (×2): 25 ug via INTRAVENOUS

## 2013-09-24 MED ORDER — GLYCOPYRROLATE 0.2 MG/ML IJ SOLN
INTRAMUSCULAR | Status: DC | PRN
  Administered 2013-09-24: 0.6 mg via INTRAVENOUS

## 2013-09-24 MED ORDER — ONDANSETRON HCL 4 MG/2ML IJ SOLN
INTRAMUSCULAR | Status: DC | PRN
  Administered 2013-09-24: 4 mg via INTRAMUSCULAR

## 2013-09-24 MED ORDER — PROPOFOL 10 MG/ML IV BOLUS
INTRAVENOUS | Status: DC | PRN
  Administered 2013-09-24: 180 mg via INTRAVENOUS

## 2013-09-24 MED ORDER — ROCURONIUM BROMIDE 100 MG/10ML IV SOLN
INTRAVENOUS | Status: DC | PRN
  Administered 2013-09-24: 35 mg via INTRAVENOUS

## 2013-09-24 MED ORDER — OXYCODONE-ACETAMINOPHEN 5-325 MG PO TABS: 1.0000 | ORAL_TABLET | ORAL | Status: DC | PRN

## 2013-09-24 MED ORDER — 0.9 % SODIUM CHLORIDE (POUR BTL) OPTIME
TOPICAL | Status: DC | PRN
  Administered 2013-09-24: 1000 mL

## 2013-09-24 MED ORDER — MIDAZOLAM HCL 5 MG/5ML IJ SOLN
INTRAMUSCULAR | Status: DC | PRN
  Administered 2013-09-24: 1 mg via INTRAVENOUS

## 2013-09-24 MED ORDER — BUPIVACAINE-EPINEPHRINE PF 0.25-1:200000 % IJ SOLN
INTRAMUSCULAR | Status: AC
  Filled 2013-09-24: qty 30

## 2013-09-24 MED ORDER — FENTANYL CITRATE 0.05 MG/ML IJ SOLN
INTRAMUSCULAR | Status: AC
  Filled 2013-09-24: qty 2

## 2013-09-24 MED ORDER — LACTATED RINGERS IV SOLN
INTRAVENOUS | Status: DC | PRN
  Administered 2013-09-24 (×2): via INTRAVENOUS

## 2013-09-24 MED ORDER — SUCCINYLCHOLINE CHLORIDE 20 MG/ML IJ SOLN
INTRAMUSCULAR | Status: DC | PRN
  Administered 2013-09-24: 100 mg via INTRAVENOUS

## 2013-09-24 MED ORDER — DEXAMETHASONE SODIUM PHOSPHATE 10 MG/ML IJ SOLN
INTRAMUSCULAR | Status: DC | PRN
  Administered 2013-09-24: 4 mg via INTRAVENOUS

## 2013-09-24 MED ORDER — LIDOCAINE HCL (CARDIAC) 20 MG/ML IV SOLN
INTRAVENOUS | Status: DC | PRN
  Administered 2013-09-24: 80 mg via INTRAVENOUS

## 2013-09-24 MED ORDER — FENTANYL CITRATE 0.05 MG/ML IJ SOLN
INTRAMUSCULAR | Status: DC | PRN
  Administered 2013-09-24: 50 ug via INTRAVENOUS
  Administered 2013-09-24: 150 ug via INTRAVENOUS
  Administered 2013-09-24: 50 ug via INTRAVENOUS

## 2013-09-24 MED ORDER — BUPIVACAINE-EPINEPHRINE 0.25% -1:200000 IJ SOLN
INTRAMUSCULAR | Status: DC | PRN
  Administered 2013-09-24: 30 mL

## 2013-09-24 MED ORDER — KETOROLAC TROMETHAMINE 30 MG/ML IJ SOLN
INTRAMUSCULAR | Status: DC | PRN
  Administered 2013-09-24: 30 mg via INTRAVENOUS

## 2013-09-24 MED ORDER — LACTATED RINGERS IV SOLN
INTRAVENOUS | Status: DC
  Administered 2013-09-24: 10:00:00 via INTRAVENOUS

## 2013-09-24 MED ORDER — NEOSTIGMINE METHYLSULFATE 1 MG/ML IJ SOLN
INTRAMUSCULAR | Status: DC | PRN
  Administered 2013-09-24: 4 mg via INTRAVENOUS

## 2013-09-24 SURGICAL SUPPLY — 39 items
APL SKNCLS STERI-STRIP NONHPOA (GAUZE/BANDAGES/DRESSINGS) ×1
APPLIER CLIP 5 13 M/L LIGAMAX5 (MISCELLANEOUS) ×2
APR CLP MED LRG 5 ANG JAW (MISCELLANEOUS) ×1
BAG SPEC RTRVL LRG 6X4 10 (ENDOMECHANICALS)
BANDAGE ADHESIVE 1X3 (GAUZE/BANDAGES/DRESSINGS) ×5 IMPLANT
BENZOIN TINCTURE PRP APPL 2/3 (GAUZE/BANDAGES/DRESSINGS) ×2 IMPLANT
CANISTER SUCTION 2500CC (MISCELLANEOUS) ×2 IMPLANT
CHLORAPREP W/TINT 26ML (MISCELLANEOUS) ×2 IMPLANT
CLIP APPLIE 5 13 M/L LIGAMAX5 (MISCELLANEOUS) ×1 IMPLANT
COVER MAYO STAND STRL (DRAPES) IMPLANT
COVER SURGICAL LIGHT HANDLE (MISCELLANEOUS) ×2 IMPLANT
DECANTER SPIKE VIAL GLASS SM (MISCELLANEOUS) ×1 IMPLANT
DRAPE C-ARM 42X72 X-RAY (DRAPES) IMPLANT
ELECT REM PT RETURN 9FT ADLT (ELECTROSURGICAL) ×2
ELECTRODE REM PT RTRN 9FT ADLT (ELECTROSURGICAL) ×1 IMPLANT
GLOVE BIOGEL PI IND STRL 7.5 (GLOVE) ×1 IMPLANT
GLOVE BIOGEL PI INDICATOR 7.5 (GLOVE) ×1
GLOVE SURG SIGNA 7.5 PF LTX (GLOVE) ×2 IMPLANT
GLOVE SURG SS PI 6.5 STRL IVOR (GLOVE) ×2 IMPLANT
GLOVE SURG SS PI 7.5 STRL IVOR (GLOVE) ×1 IMPLANT
GOWN STRL NON-REIN LRG LVL3 (GOWN DISPOSABLE) ×6 IMPLANT
GOWN STRL REIN XL XLG (GOWN DISPOSABLE) ×2 IMPLANT
KIT BASIN OR (CUSTOM PROCEDURE TRAY) ×2 IMPLANT
KIT ROOM TURNOVER OR (KITS) ×2 IMPLANT
NS IRRIG 1000ML POUR BTL (IV SOLUTION) ×2 IMPLANT
PAD ARMBOARD 7.5X6 YLW CONV (MISCELLANEOUS) ×3 IMPLANT
POUCH SPECIMEN RETRIEVAL 10MM (ENDOMECHANICALS) IMPLANT
SCISSORS LAP 5X35 DISP (ENDOMECHANICALS) IMPLANT
SET CHOLANGIOGRAPH 5 50 .035 (SET/KITS/TRAYS/PACK) IMPLANT
SET IRRIG TUBING LAPAROSCOPIC (IRRIGATION / IRRIGATOR) ×2 IMPLANT
SLEEVE ENDOPATH XCEL 5M (ENDOMECHANICALS) ×4 IMPLANT
SPECIMEN JAR SMALL (MISCELLANEOUS) ×2 IMPLANT
SUT MON AB 4-0 PC3 18 (SUTURE) ×2 IMPLANT
TOWEL OR 17X24 6PK STRL BLUE (TOWEL DISPOSABLE) ×2 IMPLANT
TOWEL OR 17X26 10 PK STRL BLUE (TOWEL DISPOSABLE) ×2 IMPLANT
TRAY LAPAROSCOPIC (CUSTOM PROCEDURE TRAY) ×2 IMPLANT
TROCAR XCEL BLUNT TIP 100MML (ENDOMECHANICALS) ×2 IMPLANT
TROCAR XCEL NON-BLD 5MMX100MML (ENDOMECHANICALS) ×2 IMPLANT
WATER STERILE IRR 1000ML POUR (IV SOLUTION) IMPLANT

## 2013-09-24 NOTE — H&P (Signed)
Patient ID: Kathleen Roman, female DOB: 1961-10-06, 52 y.o. MRN: [id number]  Chief Complaint   Patient presents with   .  New Evaluation     eval abnormal hida scan   HPI  Kathleen Roman is a 52 y.o. female.  HPI  This is a 52 year old female referred by Dr.Ganem and Outlaw for evaluation of biliary dyskinesia. She has been having significant gastrointestinal problems for the last 4 weeks. Her biggest complaint is difficulty swallowing and a sensation of food is held up. She will also have some nausea and emesis from time to time. She relates this to everything she needs. There is some mild discomfort in the epigastric area. She has had an extensive workup including esophageal study, an upper GI series, upper endoscopy, ultrasound, and HIDA scan. She feels that she is dehydrated because of her inability to keep anything down.  Past Medical History   Diagnosis  Date   .  Vitamin D deficiency disease    .  Back pain    .  Myalgia    .  Myositis    .  GERD (gastroesophageal reflux disease)     Past Surgical History   Procedure  Laterality  Date   .  Eye surgery     .  Colonscopy     History reviewed. No pertinent family history.  Social History  History   Substance Use Topics   .  Smoking status:  Never Smoker   .  Smokeless tobacco:  Never Used   .  Alcohol Use:  No    Allergies   Allergen  Reactions   .  Darvocet [Propoxyphene-Acetaminophen]      hallucinations   .  Hydrocodone-Acetaminophen    .  Peanuts [Nuts]  Other (See Comments)     All nuts. Shortness of breath    Current Outpatient Prescriptions   Medication  Sig  Dispense  Refill   .  EPINEPHrine (EPIPEN IJ)  Inject as directed.     Marland Kitchen  esomeprazole (NEXIUM) 40 MG capsule  Take 40 mg by mouth 2 (two) times daily before a meal.     .  cholecalciferol (VITAMIN D) 1000 UNITS tablet  Take 1,000 Units by mouth daily.     Marland Kitchen  guaiFENesin (MUCINEX) 600 MG 12 hr tablet  Take 1,200 mg by mouth 2 (two) times daily.     .   mometasone (NASONEX) 50 MCG/ACT nasal spray  Place 2 sprays into the nose daily.     .  promethazine (PHENERGAN) 12.5 MG tablet       No current facility-administered medications for this visit.   Review of Systems  Review of Systems  Constitutional: Negative for fever, chills and unexpected weight change.  HENT: Negative for congestion, hearing loss, sore throat, trouble swallowing and voice change.  Eyes: Negative for visual disturbance.  Respiratory: Negative for cough and wheezing.  Cardiovascular: Negative for chest pain, palpitations and leg swelling.  Gastrointestinal: Positive for nausea, vomiting and abdominal pain. Negative for diarrhea, constipation, blood in stool, abdominal distention and anal bleeding.  Genitourinary: Negative for hematuria, vaginal bleeding and difficulty urinating.  Musculoskeletal: Negative for arthralgias.  Skin: Negative for rash and wound.  Neurological: Negative for seizures, syncope and headaches.  Hematological: Negative for adenopathy. Does not bruise/bleed easily.  Psychiatric/Behavioral: Negative for confusion.  Blood pressure 102/80, pulse 68, temperature 99.1 F (37.3 C), temperature source Temporal, resp. rate 14, height 5' 1.25" (1.556 m), weight 111 lb 12.8 oz (  50.712 kg).  Physical Exam  Physical Exam  Constitutional: She is oriented to person, place, and time. She appears well-developed and well-nourished. No distress.  HENT:  Head: Normocephalic and atraumatic.  Right Ear: External ear normal.  Left Ear: External ear normal.  Nose: Nose normal.  Mouth/Throat: Oropharynx is clear and moist. No oropharyngeal exudate.  Eyes: Conjunctivae are normal. Pupils are equal, round, and reactive to light. Right eye exhibits no discharge. Left eye exhibits no discharge. No scleral icterus.  Neck: Neck supple. No tracheal deviation present.  Cardiovascular: Normal rate, regular rhythm, normal heart sounds and intact distal pulses.  No murmur heard.   Pulmonary/Chest: Effort normal and breath sounds normal. No respiratory distress. She has no wheezes.  Abdominal: Soft. Bowel sounds are normal. There is no tenderness. There is no rebound and no guarding.  Musculoskeletal: Normal range of motion. She exhibits no edema and no tenderness.  Lymphadenopathy:  She has no cervical adenopathy.  Neurological: She is alert and oriented to person, place, and time.  Skin: Skin is warm and dry. No rash noted. She is not diaphoretic. No erythema.  Psychiatric: Her behavior is normal.  Data Reviewed  I have reviewed all her studies. Her ultrasound shows gallbladder crystals. Her HIDA scan shows a 5% gallbladder ejection fraction  Assessment  Biliary dyskinesia  Plan  I had a long discussion with the patient and her husband. She certainly has an indication to take out her gallbladder via a laparoscopic cholecystectomy given the results. It is difficult to tell whether this will resolve the esophageal symptoms. However, no other source of her symptoms can be identified despite the thorough workup. I discussed the surgery with her in detail. I discussed the risks which includes but is not limited to bleeding, infection, bile duct injury, bile leak, the need to convert to an open procedure, and the real possibility that this may not resolve her symptoms. I also discussed postoperative recovery and what to expect. After a long discussion, she wishes to proceed with a laparoscopic cholecystectomy. Surgery will be scheduled urgently

## 2013-09-24 NOTE — Anesthesia Procedure Notes (Signed)
Procedure Name: Intubation Date/Time: 09/24/2013 11:27 AM Performed by: Ellin Goodie Pre-anesthesia Checklist: Patient identified, Emergency Drugs available, Suction available, Patient being monitored and Timeout performed Patient Re-evaluated:Patient Re-evaluated prior to inductionOxygen Delivery Method: Circle system utilized Preoxygenation: Pre-oxygenation with 100% oxygen Intubation Type: Rapid sequence and IV induction Ventilation: Mask ventilation throughout procedure Laryngoscope Size: Mac and 3 Grade View: Grade I Tube type: Oral Tube size: 7.5 mm Number of attempts: 1 Airway Equipment and Method: Stylet Placement Confirmation: ETT inserted through vocal cords under direct vision,  positive ETCO2 and breath sounds checked- equal and bilateral Secured at: 22 cm Tube secured with: Tape Dental Injury: Teeth and Oropharynx as per pre-operative assessment  Comments: Easy atraumatic induction and intubation with MAC 3 blade.  Dr. Randa Evens verified placement of ETT.  Carlynn Herald, CRNA

## 2013-09-24 NOTE — Anesthesia Preprocedure Evaluation (Addendum)
Anesthesia Evaluation  Patient identified by MRN, date of birth, ID band Patient awake    Reviewed: Allergy & Precautions, H&P , NPO status , Patient's Chart, lab work & pertinent test results, reviewed documented beta blocker date and time   Airway Mallampati: II TM Distance: >3 FB     Dental  (+) Teeth Intact and Dental Advisory Given   Pulmonary neg pulmonary ROS,  breath sounds clear to auscultation        Cardiovascular negative cardio ROS  Rhythm:Regular Rate:Normal     Neuro/Psych  Headaches,  Neuromuscular disease    GI/Hepatic Neg liver ROS, hiatal hernia, GERD-  Medicated and Poorly Controlled,GI history noted.   Endo/Other    Renal/GU negative Renal ROS     Musculoskeletal   Abdominal (+)  Abdomen: soft. Bowel sounds: normal.  Peds  Hematology   Anesthesia Other Findings   Reproductive/Obstetrics                         Anesthesia Physical Anesthesia Plan  ASA: II  Anesthesia Plan: General   Post-op Pain Management:    Induction:   Airway Management Planned: Oral ETT  Additional Equipment:   Intra-op Plan:   Post-operative Plan: Extubation in OR  Informed Consent: I have reviewed the patients History and Physical, chart, labs and discussed the procedure including the risks, benefits and alternatives for the proposed anesthesia with the patient or authorized representative who has indicated his/her understanding and acceptance.   Dental advisory given  Plan Discussed with: CRNA, Anesthesiologist and Surgeon  Anesthesia Plan Comments:         Anesthesia Quick Evaluation

## 2013-09-24 NOTE — Preoperative (Signed)
Beta Blockers   Reason not to administer Beta Blockers:Not Applicable 

## 2013-09-24 NOTE — Anesthesia Postprocedure Evaluation (Signed)
  Anesthesia Post-op Note  Patient: Kathleen Roman  Procedure(s) Performed: Procedure(s): LAPAROSCOPIC CHOLECYSTECTOMY (N/A)  Patient Location: PACU  Anesthesia Type:General  Level of Consciousness: awake, alert  and oriented  Airway and Oxygen Therapy: Patient Spontanous Breathing  Post-op Pain: mild  Post-op Assessment: Post-op Vital signs reviewed  Post-op Vital Signs: Reviewed and stable  Complications: No apparent anesthesia complications

## 2013-09-24 NOTE — Op Note (Signed)
Laparoscopic Cholecystectomy Procedure Note  Indications: This patient presents with symptomatic gallbladder disease and will undergo laparoscopic cholecystectomy.  Pre-operative Diagnosis: biliary dyskinesia  Post-operative Diagnosis: Same  Surgeon: Abigail Miyamoto A   Assistants: 0  Anesthesia: General endotracheal anesthesia  ASA Class: 2  Procedure Details  The patient was seen again in the Holding Room. The risks, benefits, complications, treatment options, and expected outcomes were discussed with the patient. The possibilities of reaction to medication, pulmonary aspiration, perforation of viscus, bleeding, recurrent infection, finding a normal gallbladder, the need for additional procedures, failure to diagnose a condition, the possible need to convert to an open procedure, and creating a complication requiring transfusion or operation were discussed with the patient. The likelihood of improving the patient's symptoms with return to their baseline status is good.  The patient and/or family concurred with the proposed plan, giving informed consent. The site of surgery properly noted. The patient was taken to Operating Room, identified as Kathleen Roman and the procedure verified as Laparoscopic Cholecystectomy with Intraoperative Cholangiogram. A Time Out was held and the above information confirmed.  Prior to the induction of general anesthesia, antibiotic prophylaxis was administered. General endotracheal anesthesia was then administered and tolerated well. After the induction, the abdomen was prepped with Chloraprep and draped in sterile fashion. The patient was positioned in the supine position.  Local anesthetic agent was injected into the skin near the umbilicus and an incision made. We dissected down to the abdominal fascia with blunt dissection.  The fascia was incised vertically and we entered the peritoneal cavity bluntly.  A pursestring suture of 0-Vicryl was placed around  the fascial opening.  The Hasson cannula was inserted and secured with the stay suture.  Pneumoperitoneum was then created with CO2 and tolerated well without any adverse changes in the patient's vital signs. An 11-mm port was placed in the subxiphoid position.  Two 5-mm ports were placed in the right upper quadrant. All skin incisions were infiltrated with a local anesthetic agent before making the incision and placing the trocars.   We positioned the patient in reverse Trendelenburg, tilted slightly to the patient's left.  The gallbladder was identified, the fundus grasped and retracted cephalad. Adhesions were lysed bluntly and with the electrocautery where indicated, taking care not to injure any adjacent organs or viscus. The infundibulum was grasped and retracted laterally, exposing the peritoneum overlying the triangle of Calot. This was then divided and exposed in a blunt fashion. The cystic duct was clearly identified and bluntly dissected circumferentially. A critical view of the cystic duct and cystic artery was obtained.  The cystic duct was then ligated with clips and divided. The cystic artery was, dissected free, ligated with clips and divided as well.   The gallbladder was dissected from the liver bed in retrograde fashion with the electrocautery. The gallbladder was removed. The liver bed was irrigated and inspected. Hemostasis was achieved with the electrocautery. Copious irrigation was utilized and was repeatedly aspirated until clear.  The gallbladder was then removed through the umbilical port site.  The pursestring suture was used to close the umbilical fascia.    We again inspected the right upper quadrant for hemostasis.  Pneumoperitoneum was released as we removed the trocars.  4-0 Monocryl was used to close the skin.   Benzoin, steri-strips, and clean dressings were applied. The patient was then extubated and brought to the recovery room in stable condition. Instrument, sponge, and  needle counts were correct at closure and at the conclusion  of the case.   Findings: Cholecystitis without Cholelithiasis Stomach and visible esophagus  Estimated Blood Loss: Minimal         Drains: 0         Specimens: Gallbladder           Complications: None; patient tolerated the procedure well.         Disposition: PACU - hemodynamically stable.         Condition: stable

## 2013-09-24 NOTE — Transfer of Care (Signed)
Immediate Anesthesia Transfer of Care Note  Patient: Kathleen Roman  Procedure(s) Performed: Procedure(s): LAPAROSCOPIC CHOLECYSTECTOMY (N/A)  Patient Location: PACU  Anesthesia Type:General  Level of Consciousness: awake, alert  and oriented  Airway & Oxygen Therapy: Patient connected to face mask  Post-op Assessment: Report given to PACU RN  Post vital signs: stable  Complications: No apparent anesthesia complications

## 2013-09-28 ENCOUNTER — Encounter (HOSPITAL_COMMUNITY): Payer: Self-pay | Admitting: Surgery

## 2013-10-05 ENCOUNTER — Ambulatory Visit (INDEPENDENT_AMBULATORY_CARE_PROVIDER_SITE_OTHER): Payer: 59 | Admitting: Surgery

## 2013-10-05 ENCOUNTER — Encounter (INDEPENDENT_AMBULATORY_CARE_PROVIDER_SITE_OTHER): Payer: Self-pay | Admitting: Surgery

## 2013-10-05 VITALS — BP 112/70 | HR 84 | Resp 16 | Ht 61.0 in | Wt 107.8 lb

## 2013-10-05 DIAGNOSIS — Z09 Encounter for follow-up examination after completed treatment for conditions other than malignant neoplasm: Secondary | ICD-10-CM

## 2013-10-05 NOTE — Progress Notes (Signed)
Subjective:     Patient ID: Kathleen Roman, female   DOB: [birth year], 52 y.o.   MRN: [id number]  HPI She is here for her first postop visit. She reports her symptoms are 90% resolved. She is keeping most her food down   Review of Systems     Objective:   Physical Exam On exam, she looks great. Her abdomen is soft and her incisions are well healed. The final pathology showed mild chronic cholecystitis    Assessment:     Patient stable postop     Plan:     She will refrain from heavy lifting for one more week. If her symptoms persist, she will keep her appointment at [facility]. If they continue to improve she'll cancel the appointment. I will see her back here as needed

## 2015-04-13 ENCOUNTER — Other Ambulatory Visit: Payer: Self-pay | Admitting: Obstetrics and Gynecology

## 2015-04-14 LAB — CYTOLOGY - PAP

## 2016-07-13 ENCOUNTER — Emergency Department (HOSPITAL_COMMUNITY)
Admission: EM | Admit: 2016-07-13 | Discharge: 2016-07-14 | Disposition: A | Discharge status: Home / Self Care | Payer: 59 | Attending: Emergency Medicine | Admitting: Emergency Medicine

## 2016-07-13 ENCOUNTER — Emergency Department (HOSPITAL_COMMUNITY): Payer: 59

## 2016-07-13 ENCOUNTER — Encounter (HOSPITAL_COMMUNITY): Payer: Self-pay | Admitting: *Deleted

## 2016-07-13 DIAGNOSIS — R0789 Other chest pain: Secondary | ICD-10-CM | POA: Diagnosis not present

## 2016-07-13 DIAGNOSIS — R079 Chest pain, unspecified: Secondary | ICD-10-CM | POA: Diagnosis present

## 2016-07-13 DIAGNOSIS — Z91010 Allergy to peanuts: Secondary | ICD-10-CM | POA: Diagnosis not present

## 2016-07-13 LAB — CBC
HCT: 37.9 % (ref 36.0–46.0)
Hemoglobin: 12.6 g/dL (ref 12.0–15.0)
MCH: 30.9 pg (ref 26.0–34.0)
MCHC: 33.2 g/dL (ref 30.0–36.0)
MCV: 92.9 fL (ref 78.0–100.0)
Platelets: 165 10*3/uL (ref 150–400)
RBC: 4.08 MIL/uL (ref 3.87–5.11)
RDW: 12.3 % (ref 11.5–15.5)
WBC: 6.2 10*3/uL (ref 4.0–10.5)

## 2016-07-13 LAB — I-STAT TROPONIN, ED: Troponin i, poc: 0 ng/mL (ref 0.00–0.08)

## 2016-07-13 LAB — BASIC METABOLIC PANEL
Anion gap: 7 (ref 5–15)
BUN: 9 mg/dL (ref 6–20)
CHLORIDE: 107 mmol/L (ref 101–111)
CO2: 25 mmol/L (ref 22–32)
Calcium: 9.3 mg/dL (ref 8.9–10.3)
Creatinine, Ser: 0.81 mg/dL (ref 0.44–1.00)
GFR calc Af Amer: >60 mL/min (ref >60)
GFR calc non Af Amer: >60 mL/min (ref >60)
GLUCOSE: 130 mg/dL — AB (ref 65–99)
Potassium: 3.6 mmol/L (ref 3.5–5.1)
Sodium: 139 mmol/L (ref 135–145)

## 2016-07-13 MED ORDER — NITROGLYCERIN 0.4 MG SL SUBL: 0.4000 mg | SUBLINGUAL_TABLET | SUBLINGUAL | Status: DC | PRN

## 2016-07-13 NOTE — ED Triage Notes (Signed)
Pt reports constantchest pain since Tuesday, started to ease up but tonight chest pain came back stronger. Pt had been taken ibuprofen thinking it was a pulled muscle. Pt states tonight she started to have pain in her left shoulder and both of her hands felt numb. Pt denies shortness of breath, denies n/v.

## 2016-07-13 NOTE — ED Provider Notes (Signed)
[facility]-EMERGENCY DEPT Provider Note   CSN: [id number] Arrival date & time: 07/13/16  2156  First Provider Contact:  First MD Initiated Contact with Patient 07/13/16 2328    By signing my name below, I, Linna Darner, attest that this documentation has been prepared under the direction and in the presence of physician practitioner, Derwood Kaplan, MD. Electronically Signed: Linna Darner, Scribe. 07/13/2016. 11:27 PM.   History   Chief Complaint Chief Complaint  Patient presents with  . Chest Pain    The history is provided by the patient. No language interpreter was used.     HPI Comments: Kathleen Roman is a 55 y.o. female with PMHx of GERD who presents to the Emergency Department complaining of sudden onset, constant, left sided chest pain beginning three days ago. Pt states her CP is non-radiating. She states her pain is 4/10 currently and 5/10 at worst. Pt reports intermittent chills since onset. Pt states she took ibuprofen with good relief of her chest pain over the last 2 days, but her pain became worse tonight after shopping and eating. Pt reports she did not exert herself any more than usual today. She notes bilateral shoulder pain, tingling in her bilateral hands, and generalized weakness beginning tonight as well. She states her chest pain is difficult to describe in quality. She denies chest discomfort with exertion prior to onset. She denies h/o smoking or substance abuse. Pt denies FHx of heart attacks, personal h/o heart attack, h/o blood clot, estrogen use or hormone replacement therapy, recent long travels or major surgeries, h/o cancer, or h/o neck/shoulder issues. 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 Pt denies SOB, diaphoresis, or any other associated symptoms. The pain is not similar to GERD. The pain is not worse with inspiration. Certain movements do make the pain worse.  Past Medical History:  Diagnosis Date  . Back pain   . GERD (gastroesophageal reflux  disease)   . Headache(784.0)    Hx: of sinus headaches  . Myalgia   . Myositis   . Vitamin D deficiency disease     Patient Active Problem List   Diagnosis Date Noted  . Biliary dyskinesia 09/21/2013    Past Surgical History:  Procedure Laterality Date  . CHOLECYSTECTOMY N/A 09/24/2013   Procedure: LAPAROSCOPIC CHOLECYSTECTOMY;  Surgeon: Shelly Rubenstein, MD;  Location: [facility] OR;  Service: General;  Laterality: N/A;  . colonscopy    . DILATION AND CURETTAGE OF UTERUS    . EYE SURGERY      OB History    No data available       Home Medications    Prior to Admission medications   Medication Sig Start Date End Date Taking? Authorizing Provider  cholecalciferol (VITAMIN D) 1000 units tablet Take 1,000 Units by mouth daily.   Yes Historical Provider, MD  EPINEPHrine 0.3 mg/0.3 mL IJ SOAJ injection Inject 0.3 mg into the muscle daily as needed (allergic reaction).   Yes Historical Provider, MD  ondansetron (ZOFRAN) 4 MG/5ML solution Take 5 mLs (4 mg total) by mouth 2 (two) times daily as needed for nausea. 09/21/13  Yes Abigail Miyamoto, MD  naproxen (NAPROSYN) 500 MG tablet Take 1 tablet (500 mg total) by mouth 2 (two) times daily. 07/14/16   Derwood Kaplan, MD    Family History History reviewed. No pertinent family history.  Social History Social History  Substance Use Topics  . Smoking status: Never Smoker  . Smokeless tobacco: Never Used  . Alcohol  use No     Allergies   Darvocet [propoxyphene n-acetaminophen]; Hydrocodone-acetaminophen; and Peanuts [nuts]   Review of Systems Review of Systems  A complete 10 system review of systems was obtained and all systems are negative except as noted in the HPI and PMH.   Physical Exam Updated Vital Signs BP 104/62   Pulse 91   Temp 98.5 F (36.9 C) (Oral)   Resp 19   Ht 5' 1.5" (1.562 m)   Wt 127 lb (57.6 kg)   SpO2 96%   BMI 23.61 kg/m   Physical Exam  Constitutional: She is oriented to person, place, and  time. She appears well-developed and well-nourished. No distress.  HENT:  Head: Normocephalic and atraumatic.  Eyes: Conjunctivae and EOM are normal.  Neck: Neck supple. No tracheal deviation present.  Cardiovascular: Regular rhythm.   Regular rhythm with intermittent extra beat. No JVD.  Pulmonary/Chest: Effort normal. No respiratory distress.  Lungs clear to auscultation.  Musculoskeletal: Normal range of motion.  Lower extremity exam reveals no pitting edema, no unilateral calf swelling. 2+ and equal radial pulse bilaterally.  Neurological: She is alert and oriented to person, place, and time.  Skin: Skin is warm and dry.  Skin is warm to the touch.  Psychiatric: She has a normal mood and affect. Her behavior is normal.  Nursing note and vitals reviewed.  ED Treatments / Results  Labs (all labs ordered are listed, but only abnormal results are displayed) Labs Reviewed  BASIC METABOLIC PANEL - Abnormal; Notable for the following:       Result Value   Glucose, Bld 130 (*)    All other components within normal limits  CBC  I-STAT TROPOININ, ED  I-STAT TROPOININ, ED    EKG  EKG Interpretation  Date/Time:  Friday July 13 2016 22:05:51 EDT Ventricular Rate:  100 PR Interval:  138 QRS Duration: 64 QT Interval:  360 QTC Calculation: 464 R Axis:   38 Text Interpretation:  Sinus rhythm with frequent Premature ventricular complexes Right atrial enlargement Nonspecific ST abnormality Abnormal ECG ST depression in the apical leads, inferior leads - new Confirmed by Rhunette Croft, MD, Janey Genta [phone]) on 07/13/2016 11:30:17 PM       Radiology Dg Chest 2 View  Result Date: 07/13/2016 CLINICAL DATA:  LEFT chest pain after lifting grandson 3 days ago. EXAM: CHEST  2 VIEW COMPARISON:  None.  Chest radiograph May 20, 2011 FINDINGS: Cardiomediastinal silhouette is normal. The lungs are clear without pleural effusions or focal consolidations. Trachea projects midline and there is no  pneumothorax. Soft tissue planes and included osseous structures are non-suspicious. Surgical clips in the included right abdomen compatible with cholecystectomy. IMPRESSION: Normal chest. Electronically Signed   By: Awilda Metro M.D.   On: 07/13/2016 22:58    Procedures Procedures (including critical care time)  DIAGNOSTIC STUDIES: Oxygen Saturation is 96% on RA, adequate by my interpretation.    COORDINATION OF CARE: 11:27 PM Discussed treatment plan with pt at bedside and pt agreed to plan.   Medications Ordered in ED Medications  nitroGLYCERIN (NITROSTAT) SL tablet 0.4 mg (not administered)     Initial Impression / Assessment and Plan / ED Course  I have reviewed the triage vital signs and the nursing notes.  Pertinent labs & imaging results that were available during my care of the patient were reviewed by me and considered in my medical decision making (see chart for details).  Clinical Course  Comment By Time  Pt reassessed and results  of her workup, including reassuring EKG, trops x 2 that are normal and normal CXR shares. Pain is atypical - but it is left sided, so we still want her to see Cards if the pain persist despite naprosyn. Derwood Kaplan, MD 08/05 [phone]    I personally performed the services described in this documentation, which was scribed in my presence. The recorded information has been reviewed and is accurate.  Differential diagnosis includes: ACS syndrome Myocarditis Pericarditis Pneumonia PE Musculoskeletal pain GERD  Pt comes in with cc of chest pain. L sided, pain x 3 days, constant. Pain is moderately severe. Pain is atypical L sided pain. HEAR score is 1 for risk factors. Pain is not fitting any specific pattern. I think cardiac cause is less likely, but she could be having atypical presentation at age 80 with ACS.  - Trops x 2 ordered. = EKG is reassuring - CXR is normal  If enzymes neg, will d/c with pcp f/u if the pain improves or  cards f/u if the pain persists. Final Clinical Impressions(s) / ED Diagnoses   Final diagnoses:  Chest wall pain  Atypical chest pain    New Prescriptions New Prescriptions   NAPROXEN (NAPROSYN) 500 MG TABLET    Take 1 tablet (500 mg total) by mouth 2 (two) times daily.     Derwood Kaplan, MD 07/14/16 [phone]

## 2016-07-14 LAB — I-STAT TROPONIN, ED: Troponin i, poc: 0 ng/mL (ref 0.00–0.08)

## 2016-07-14 MED ORDER — NAPROXEN 500 MG PO TABS: 500.0000 mg | ORAL_TABLET | Freq: Two times a day (BID) | ORAL | 0 refills | Status: DC

## 2016-07-14 MED ORDER — NAPROXEN 250 MG PO TABS
500.0000 mg | ORAL_TABLET | Freq: Once | ORAL | Status: AC
  Administered 2016-07-14: 500 mg via ORAL
  Filled 2016-07-14: qty 2

## 2016-07-14 NOTE — ED Notes (Signed)
Pt states she would prefer not to take nitro. States she has been given nitro before and it made her feel worse.

## 2016-07-14 NOTE — Discharge Instructions (Signed)
We saw you in the ER for the chest pain/shortness of breath. All of our cardiac workup is normal, including labs, EKG and chest X-RAY are normal. We are not sure what is causing your discomfort, but we feel comfortable sending you home at this time. The workup in the ER is not complete, and you should follow up with your primary care doctor or the Cardiologist for further evaluation.  Please return to the ER if you have worsening chest pain, shortness of breath, pain radiating to your jaw, shoulder, or back, sweats or fainting. Otherwise see the Cardiologist or your primary care doctor as requested.

## 2016-08-11 ENCOUNTER — Ambulatory Visit (HOSPITAL_COMMUNITY)
Admission: EM | Admit: 2016-08-11 | Discharge: 2016-08-11 | Disposition: A | Discharge status: Home / Self Care | Payer: 59 | Attending: Family Medicine | Admitting: Family Medicine

## 2016-08-11 ENCOUNTER — Encounter (HOSPITAL_COMMUNITY): Payer: Self-pay | Admitting: Emergency Medicine

## 2016-08-11 DIAGNOSIS — S61219A Laceration without foreign body of unspecified finger without damage to nail, initial encounter: Secondary | ICD-10-CM | POA: Diagnosis not present

## 2016-08-11 DIAGNOSIS — Z23 Encounter for immunization: Secondary | ICD-10-CM

## 2016-08-11 MED ORDER — TETANUS-DIPHTH-ACELL PERTUSSIS 5-2.5-18.5 LF-MCG/0.5 IM SUSP
INTRAMUSCULAR | Status: AC
  Filled 2016-08-11: qty 0.5

## 2016-08-11 MED ORDER — TETANUS-DIPHTH-ACELL PERTUSSIS 5-2.5-18.5 LF-MCG/0.5 IM SUSP
0.5000 mL | Freq: Once | INTRAMUSCULAR | Status: AC
  Administered 2016-08-11: 0.5 mL via INTRAMUSCULAR

## 2016-08-11 NOTE — ED Triage Notes (Signed)
[facility] a [facility] of a laceration to her left middle finger secondary to cutting it on a metal vent today. The patient stated that she was unsure on her last TDAP booster.

## 2016-08-11 NOTE — ED Provider Notes (Signed)
CSN: [id number]     Arrival date & time 08/11/16  1529 History   None    Chief Complaint  Patient presents with  . Laceration   (Consider location/radiation/quality/duration/timing/severity/associated sxs/prior Treatment) HPI History obtained from patient:  Pt presents with the cc of:  Left ring finger Duration of symptoms: One hour Treatment prior to arrival: Wound was washed and cleaned and bandaged. Context: Patient was cleaning her ceiling vent and finger slipped hitting the metal sustaining a small laceration. Other symptoms include: Throbbing Pain score: 2 FAMILY HISTORY: None    Past Medical History:  Diagnosis Date  . Back pain   . GERD (gastroesophageal reflux disease)   . Headache(784.0)    Hx: of sinus headaches  . Myalgia   . Myositis   . Vitamin D deficiency disease    Past Surgical History:  Procedure Laterality Date  . CHOLECYSTECTOMY N/A 09/24/2013   Procedure: LAPAROSCOPIC CHOLECYSTECTOMY;  Surgeon: Shelly Rubensteinouglas A Blackman, MD;  Location: [facility] OR;  Service: General;  Laterality: N/A;  . colonscopy    . DILATION AND CURETTAGE OF UTERUS    . EYE SURGERY     History reviewed. No pertinent family history. Social History  Substance Use Topics  . Smoking status: Never Smoker  . Smokeless tobacco: Never Used  . Alcohol use No   OB History    No data available     Review of Systems  Denies: HEADACHE, NAUSEA, ABDOMINAL PAIN, CHEST PAIN, CONGESTION, DYSURIA, SHORTNESS OF BREATH  Allergies  Darvocet [propoxyphene n-acetaminophen]; Hydrocodone-acetaminophen; and Peanuts [nuts]  Home Medications   Prior to Admission medications   Medication Sig Start Date End Date Taking? Authorizing Provider  cholecalciferol (VITAMIN D) 1000 units tablet Take 1,000 Units by mouth daily.    Historical Provider, MD  EPINEPHrine 0.3 mg/0.3 mL IJ SOAJ injection Inject 0.3 mg into the muscle daily as needed (allergic reaction).    Historical Provider, MD  naproxen (NAPROSYN) 500  MG tablet Take 1 tablet (500 mg total) by mouth 2 (two) times daily. 07/14/16   Derwood KaplanAnkit Nanavati, MD  ondansetron (ZOFRAN) 4 MG/5ML solution Take 5 mLs (4 mg total) by mouth 2 (two) times daily as needed for nausea. 09/21/13   Abigail Miyamotoouglas Blackman, MD   Meds Ordered and Administered this Visit   Medications  Tdap (BOOSTRIX) injection 0.5 mL (0.5 mLs Intramuscular Given 08/11/16 1657)    BP 111/70 (BP Location: Left Arm)   Pulse 91   Temp 98.2 F (36.8 C) (Oral)   Resp 16   SpO2 98%  No data found.   Physical Exam NURSES NOTES AND VITAL SIGNS REVIEWED. CONSTITUTIONAL: Well developed, well nourished, no acute distress HEENT: normocephalic, atraumatic EYES: Conjunctiva normal NECK:normal ROM, supple, no adenopathy PULMONARY:No respiratory distress, normal effort ABDOMINAL: Soft, ND, NT BS+, No CVAT MUSCULOSKELETAL: Normal ROM of all extremities, LEFT RING FINGER, SUPERFICIAL WOUND LESS THAN 1 CM. NO ACTIVE BLEEDING, WOUND IS CLOSED AT THIS TIME.  SENSORY AND MOTOR INTACT SKIN: warm and dry without rash PSYCHIATRIC: Mood and affect, behavior are normal  Urgent Care Course   Clinical Course    .Marland Kitchen.Laceration Repair Date/Time: 08/11/2016 5:06 PM Performed by: Tharon AquasPATRICK, FRANK C Authorized by: Bradd CanaryKINDL, JAMES D   Consent:    Consent obtained:  Verbal   Consent given by:  Patient Anesthesia (see MAR for exact dosages):    Anesthesia method:  None Laceration details:    Location:  Finger   Finger location:  L ring finger   Length (cm):  1 Repair type:    Repair type:  Simple Treatment:    Amount of cleaning:  Standard   Visualized foreign bodies/material removed: no   Skin repair:    Repair method:  Steri-Strips and tissue adhesive Approximation:    Approximation:  Close   Vermilion border: well-aligned   Post-procedure details:    Dressing:  Non-adherent dressing   Patient tolerance of procedure:  Tolerated well, no immediate complications   (including critical care time)  Labs  Review Labs Reviewed - No data to display  Imaging Review No results found.   Visual Acuity Review  Right Eye Distance:   Left Eye Distance:   Bilateral Distance:    Right Eye Near:   Left Eye Near:    Bilateral Near:         MDM   1. Laceration of finger, initial encounter     Patient is reassured that there are no issues that require transfer to higher level of care at this time or additional tests. Patient is advised to continue home symptomatic treatment. Patient is advised that if there are new or worsening symptoms to attend the emergency department, contact primary care provider, or return to UC. Instructions of care provided discharged home in stable condition.    THIS NOTE WAS GENERATED USING A VOICE RECOGNITION SOFTWARE PROGRAM. ALL REASONABLE EFFORTS  WERE MADE TO PROOFREAD THIS DOCUMENT FOR ACCURACY.  I have verbally reviewed the discharge instructions with the patient. A printed AVS was given to the patient.  All questions were answered prior to discharge.      Tharon Aquas, PA 08/11/16 [phone]

## 2016-12-11 DIAGNOSIS — J0100 Acute maxillary sinusitis, unspecified: Secondary | ICD-10-CM | POA: Diagnosis not present

## 2017-02-28 DIAGNOSIS — J3089 Other allergic rhinitis: Secondary | ICD-10-CM | POA: Diagnosis not present

## 2017-02-28 DIAGNOSIS — Z91018 Allergy to other foods: Secondary | ICD-10-CM | POA: Diagnosis not present

## 2017-03-13 DIAGNOSIS — Z79899 Other long term (current) drug therapy: Secondary | ICD-10-CM | POA: Diagnosis not present

## 2017-03-13 DIAGNOSIS — Z0000 Encounter for general adult medical examination without abnormal findings: Secondary | ICD-10-CM | POA: Diagnosis not present

## 2017-03-28 DIAGNOSIS — H52203 Unspecified astigmatism, bilateral: Secondary | ICD-10-CM | POA: Diagnosis not present

## 2017-03-28 DIAGNOSIS — H524 Presbyopia: Secondary | ICD-10-CM | POA: Diagnosis not present

## 2017-03-28 DIAGNOSIS — H5203 Hypermetropia, bilateral: Secondary | ICD-10-CM | POA: Diagnosis not present

## 2017-05-31 DIAGNOSIS — J019 Acute sinusitis, unspecified: Secondary | ICD-10-CM | POA: Diagnosis not present

## 2017-05-31 DIAGNOSIS — J301 Allergic rhinitis due to pollen: Secondary | ICD-10-CM | POA: Diagnosis not present

## 2017-05-31 DIAGNOSIS — J029 Acute pharyngitis, unspecified: Secondary | ICD-10-CM | POA: Diagnosis not present

## 2017-06-26 DIAGNOSIS — J301 Allergic rhinitis due to pollen: Secondary | ICD-10-CM | POA: Diagnosis not present

## 2018-01-21 DIAGNOSIS — Z6824 Body mass index (BMI) 24.0-24.9, adult: Secondary | ICD-10-CM | POA: Diagnosis not present

## 2018-01-21 DIAGNOSIS — Z01419 Encounter for gynecological examination (general) (routine) without abnormal findings: Secondary | ICD-10-CM | POA: Diagnosis not present

## 2018-01-24 ENCOUNTER — Other Ambulatory Visit: Payer: Self-pay | Admitting: Obstetrics and Gynecology

## 2018-01-24 DIAGNOSIS — R928 Other abnormal and inconclusive findings on diagnostic imaging of breast: Secondary | ICD-10-CM

## 2018-01-30 DIAGNOSIS — M810 Age-related osteoporosis without current pathological fracture: Secondary | ICD-10-CM | POA: Diagnosis not present

## 2018-01-31 ENCOUNTER — Other Ambulatory Visit: Payer: Self-pay | Admitting: Obstetrics and Gynecology

## 2018-01-31 ENCOUNTER — Ambulatory Visit
Admission: RE | Admit: 2018-01-31 | Discharge: 2018-01-31 | Disposition: A | Discharge status: Home / Self Care | Payer: 59 | Source: Ambulatory Visit | Attending: Obstetrics and Gynecology | Admitting: Obstetrics and Gynecology

## 2018-01-31 DIAGNOSIS — R928 Other abnormal and inconclusive findings on diagnostic imaging of breast: Secondary | ICD-10-CM

## 2018-01-31 DIAGNOSIS — N6002 Solitary cyst of left breast: Secondary | ICD-10-CM | POA: Diagnosis not present

## 2018-01-31 DIAGNOSIS — N6320 Unspecified lump in the left breast, unspecified quadrant: Secondary | ICD-10-CM

## 2018-03-03 DIAGNOSIS — Z91018 Allergy to other foods: Secondary | ICD-10-CM | POA: Diagnosis not present

## 2018-03-03 DIAGNOSIS — J3089 Other allergic rhinitis: Secondary | ICD-10-CM | POA: Diagnosis not present

## 2018-03-31 DIAGNOSIS — H5203 Hypermetropia, bilateral: Secondary | ICD-10-CM | POA: Diagnosis not present

## 2018-03-31 DIAGNOSIS — H52203 Unspecified astigmatism, bilateral: Secondary | ICD-10-CM | POA: Diagnosis not present

## 2018-03-31 DIAGNOSIS — H524 Presbyopia: Secondary | ICD-10-CM | POA: Diagnosis not present

## 2019-01-21 DIAGNOSIS — Z20828 Contact with and (suspected) exposure to other viral communicable diseases: Secondary | ICD-10-CM | POA: Diagnosis not present

## 2019-01-21 DIAGNOSIS — J988 Other specified respiratory disorders: Secondary | ICD-10-CM | POA: Diagnosis not present

## 2019-01-21 DIAGNOSIS — R05 Cough: Secondary | ICD-10-CM | POA: Diagnosis not present

## 2019-04-20 DIAGNOSIS — R197 Diarrhea, unspecified: Secondary | ICD-10-CM | POA: Diagnosis not present

## 2019-04-24 DIAGNOSIS — K591 Functional diarrhea: Secondary | ICD-10-CM | POA: Diagnosis not present

## 2019-04-27 DIAGNOSIS — K591 Functional diarrhea: Secondary | ICD-10-CM | POA: Diagnosis not present

## 2019-04-27 DIAGNOSIS — F5102 Adjustment insomnia: Secondary | ICD-10-CM | POA: Diagnosis not present

## 2019-08-23 ENCOUNTER — Ambulatory Visit (HOSPITAL_COMMUNITY)
Admission: EM | Admit: 2019-08-23 | Discharge: 2019-08-23 | Disposition: A | Discharge status: Home / Self Care | Payer: 59

## 2019-08-23 ENCOUNTER — Encounter (HOSPITAL_COMMUNITY): Payer: Self-pay | Admitting: Emergency Medicine

## 2019-08-23 ENCOUNTER — Other Ambulatory Visit: Payer: Self-pay

## 2019-08-23 DIAGNOSIS — J019 Acute sinusitis, unspecified: Secondary | ICD-10-CM

## 2019-08-23 MED ORDER — AMOXICILLIN 500 MG PO CAPS: 500.0000 mg | ORAL_CAPSULE | Freq: Three times a day (TID) | ORAL | 0 refills | Status: DC

## 2019-08-23 NOTE — Discharge Instructions (Signed)
Take the amoxicillin as prescribed 3 times a day for 7 days.    Follow-up with your primary care provider tomorrow to let him know of the change in treatment.    Return here or go to the emergency department if you have worsening symptoms or develop new symptoms such as shortness of breath, fever, chills, or other concerns.

## 2019-08-23 NOTE — ED Provider Notes (Addendum)
[facility]    CSN: [id number] Arrival date & time: 08/23/19  1032      History   Chief Complaint Chief Complaint  Patient presents with  . Cough  . Appointment    10:40    HPI Kathleen Roman is a 58 y.o. female.   Patient presents with sinus congestion, postnasal drip, green nasal discharge, cough productive of green phlegm.  She was started on Septra DS by her PCP on 08/20/2019; she took 2-1/2 days worth but then felt sick from the medication including nausea and a feeling of burning in her arms.  She also is on TobraDex eyedrops for conjunctivitis which she started on 08/20/2019.  She is here requesting a change in her antibiotic to amoxicillin to treat her sinus infection.  She denies fever, chills, shortness of breath, or other symptoms.  Patient requests COVID test.  The history is provided by the patient.    Past Medical History:  Diagnosis Date  . Back pain   . GERD (gastroesophageal reflux disease)   . Headache(784.0)    Hx: of sinus headaches  . Myalgia   . Myositis   . Vitamin D deficiency disease     Patient Active Problem List   Diagnosis Date Noted  . Biliary dyskinesia 09/21/2013    Past Surgical History:  Procedure Laterality Date  . CHOLECYSTECTOMY N/A 09/24/2013   Procedure: LAPAROSCOPIC CHOLECYSTECTOMY;  Surgeon: Harl Bowie, MD;  Location: [facility];  Service: General;  Laterality: N/A;  . colonscopy    . DILATION AND CURETTAGE OF UTERUS    . EYE SURGERY      OB History   No obstetric history on file.      Home Medications    Prior to Admission medications   Medication Sig Start Date End Date Taking? Authorizing Provider  Sulfamethoxazole-Trimethoprim (SULFAMETHOXAZOLE-TMP DS PO) Take by mouth.   Yes [provider]  tobramycin-dexamethasone (TOBRADEX) ophthalmic solution every 4 (four) hours while awake.   Yes [provider]  amoxicillin (AMOXIL) 500 MG capsule Take 1 capsule (500 mg total) by mouth 3  (three) times daily. 08/23/19   Sharion Balloon, NP  cholecalciferol (VITAMIN D) 1000 units tablet Take 1,000 Units by mouth daily.    [provider]  EPINEPHrine 0.3 mg/0.3 mL IJ SOAJ injection Inject 0.3 mg into the muscle daily as needed (allergic reaction).    [provider]  naproxen (NAPROSYN) 500 MG tablet Take 1 tablet (500 mg total) by mouth 2 (two) times daily. 07/14/16   Varney Biles, MD  ondansetron (ZOFRAN) 4 MG/5ML solution Take 5 mLs (4 mg total) by mouth 2 (two) times daily as needed for nausea. 09/21/13   Coralie Keens, MD    Family History Family History  Problem Relation Age of Onset  . Breast cancer Maternal Aunt 16    Social History Social History   Tobacco Use  . Smoking status: Never Smoker  . Smokeless tobacco: Never Used  Substance Use Topics  . Alcohol use: No  . Drug use: No     Allergies   Darvocet [propoxyphene n-acetaminophen], Hydrocodone-acetaminophen, and Peanuts [nuts]   Review of Systems Review of Systems  Constitutional: Negative for chills and fever.  HENT: Positive for congestion, postnasal drip, rhinorrhea and sinus pressure. Negative for ear pain and sore throat.   Eyes: Negative for pain and visual disturbance.  Respiratory: Positive for cough. Negative for shortness of breath.   Cardiovascular: Negative for chest pain and palpitations.  Gastrointestinal: Negative for abdominal pain and vomiting.  Genitourinary: Negative for dysuria and hematuria.  Musculoskeletal: Negative for arthralgias and back pain.  Skin: Negative for color change and rash.  Neurological: Negative for seizures and syncope.  All other systems reviewed and are negative.    Physical Exam Triage Vital Signs ED Triage Vitals  Enc Vitals Group     BP 08/23/19 1129 132/81     Pulse Rate 08/23/19 1129 96     Resp 08/23/19 1129 18     Temp 08/23/19 1129 98.6 F (37 C)     Temp Source 08/23/19 1129 Oral     SpO2 08/23/19 1129 98 %      Weight --      Height --      Head Circumference --      Peak Flow --      Pain Score 08/23/19 1124 0     Pain Loc --      Pain Edu? --      Excl. in GC? --    No data found.  Updated Vital Signs BP 132/81 (BP Location: Right Arm)   Pulse 96   Temp 98.6 F (37 C) (Oral)   Resp 18   SpO2 98%   Visual Acuity Right Eye Distance:   Left Eye Distance:   Bilateral Distance:    Right Eye Near:   Left Eye Near:    Bilateral Near:     Physical Exam Vitals signs and nursing note reviewed.  Constitutional:      General: She is not in acute distress.    Appearance: She is well-developed.  HENT:     Head: Normocephalic and atraumatic.     Right Ear: Tympanic membrane normal.     Left Ear: Tympanic membrane normal.     Nose: Rhinorrhea present.     Mouth/Throat:     Mouth: Mucous membranes are moist.     Pharynx: Oropharynx is clear.  Eyes:     Conjunctiva/sclera: Conjunctivae normal.  Neck:     Musculoskeletal: Neck supple.  Cardiovascular:     Rate and Rhythm: Normal rate and regular rhythm.     Heart sounds: No murmur.  Pulmonary:     Effort: Pulmonary effort is normal. No respiratory distress.     Breath sounds: Normal breath sounds.  Abdominal:     General: Bowel sounds are normal.     Palpations: Abdomen is soft.     Tenderness: There is no abdominal tenderness. There is no guarding or rebound.  Skin:    General: Skin is warm and dry.     Findings: No rash.  Neurological:     Mental Status: She is alert.      UC Treatments / Results  Labs (all labs ordered are listed, but only abnormal results are displayed) Labs Reviewed - No data to display  EKG   Radiology No results found.  Procedures Procedures (including critical care time)  Medications Ordered in UC Medications - No data to display  Initial Impression / Assessment and Plan / UC Course  I have reviewed the triage vital signs and the nursing notes.  Pertinent labs & imaging results that  were available during my care of the patient were reviewed by me and considered in my medical decision making (see chart for details).    Acute sinusitis.  Changing antibiotic to amoxicillin which patient states has worked for her in the past with similar symptoms.  Instructed her to follow-up with  her PCP tomorrow to let them know of the change in medication.  COVID test performed here; instructed patient to self quarantine until her test results are back.  Instructed her to return here or go to the ED if she has worsening symptoms or new symptoms such as fever, chills, shortness of breath, or other concerns.  Patient agrees to plan of care.     Final Clinical Impressions(s) / UC Diagnoses   Final diagnoses:  Acute non-recurrent sinusitis, unspecified location     Discharge Instructions     Take the amoxicillin as prescribed 3 times a day for 7 days.    Follow-up with your primary care provider tomorrow to let him know of the change in treatment.    Return here or go to the emergency department if you have worsening symptoms or develop new symptoms such as shortness of breath, fever, chills, or other concerns.        ED Prescriptions    Medication Sig Dispense Auth. Provider   amoxicillin (AMOXIL) 500 MG capsule Take 1 capsule (500 mg total) by mouth 3 (three) times daily. 21 capsule Mickie Bailate, Lex Linhares H, NP     Controlled Substance Prescriptions  Controlled Substance Registry consulted? Not Applicable   Mickie Bailate, Leniyah Martell H, NP 08/23/19 1144    Mickie Bailate, Partick Musselman H, NP 08/23/19 1150

## 2019-08-23 NOTE — ED Triage Notes (Signed)
Has had sinus drainage recently.  Last week had green secretions and conjunctivitis.  pcp did give her an oral antibiotic and eye drops.  Eye drops are not a problem.    Oral antibiotic has caused constipation.  Also says felt like "arms were on fire " last night.  "sick as a dog" last night.    Sore throat has gone, continued drainage fin throat.  No chest congestion, intermittent cough.  Drainage is still green

## 2019-08-24 ENCOUNTER — Telehealth (HOSPITAL_COMMUNITY): Payer: Self-pay | Admitting: Emergency Medicine

## 2019-08-24 ENCOUNTER — Ambulatory Visit (HOSPITAL_COMMUNITY)
Admission: EM | Admit: 2019-08-24 | Discharge: 2019-08-24 | Disposition: A | Discharge status: Home / Self Care | Payer: 59 | Attending: Family Medicine | Admitting: Family Medicine

## 2019-08-24 DIAGNOSIS — Z20828 Contact with and (suspected) exposure to other viral communicable diseases: Secondary | ICD-10-CM | POA: Diagnosis present

## 2019-08-24 NOTE — ED Notes (Signed)
Created an encounter to reput covid test in for micro to see. No other posible way to do this. Lab was canceled during her visit.

## 2019-08-24 NOTE — Telephone Encounter (Signed)
Reputting in labs for covid.

## 2019-08-25 LAB — NOVEL CORONAVIRUS, NAA (HOSP ORDER, SEND-OUT TO REF LAB; TAT 18-24 HRS): SARS-CoV-2, NAA: NOT DETECTED

## 2019-08-26 ENCOUNTER — Encounter (HOSPITAL_COMMUNITY): Payer: Self-pay

## 2020-04-12 ENCOUNTER — Other Ambulatory Visit: Payer: Self-pay | Admitting: Geriatric Medicine

## 2020-04-12 DIAGNOSIS — Z1231 Encounter for screening mammogram for malignant neoplasm of breast: Secondary | ICD-10-CM

## 2023-03-04 ENCOUNTER — Ambulatory Visit
Admission: RE | Admit: 2023-03-04 | Discharge: 2023-03-04 | Disposition: A | Discharge status: Home / Self Care | Payer: 59 | Source: Ambulatory Visit | Attending: Internal Medicine | Admitting: Internal Medicine

## 2023-03-04 ENCOUNTER — Other Ambulatory Visit: Payer: Self-pay | Admitting: Internal Medicine

## 2023-03-04 DIAGNOSIS — R058 Other specified cough: Secondary | ICD-10-CM

## 2023-04-04 ENCOUNTER — Other Ambulatory Visit: Payer: Self-pay | Admitting: Internal Medicine

## 2023-04-04 ENCOUNTER — Ambulatory Visit
Admission: RE | Admit: 2023-04-04 | Discharge: 2023-04-04 | Disposition: A | Discharge status: Home / Self Care | Payer: 59 | Source: Ambulatory Visit | Attending: Internal Medicine | Admitting: Internal Medicine

## 2023-04-04 DIAGNOSIS — R9389 Abnormal findings on diagnostic imaging of other specified body structures: Secondary | ICD-10-CM

## 2023-04-10 ENCOUNTER — Other Ambulatory Visit: Payer: Self-pay | Admitting: Internal Medicine

## 2023-04-10 DIAGNOSIS — R9389 Abnormal findings on diagnostic imaging of other specified body structures: Secondary | ICD-10-CM

## 2023-04-18 ENCOUNTER — Ambulatory Visit
Admission: RE | Admit: 2023-04-18 | Discharge: 2023-04-18 | Disposition: A | Discharge status: Home / Self Care | Payer: 59 | Source: Ambulatory Visit | Attending: Internal Medicine | Admitting: Internal Medicine

## 2023-04-18 DIAGNOSIS — R9389 Abnormal findings on diagnostic imaging of other specified body structures: Secondary | ICD-10-CM

## 2023-04-18 MED ORDER — IOPAMIDOL (ISOVUE-300) INJECTION 61%
75.0000 mL | Freq: Once | INTRAVENOUS | Status: AC | PRN
  Administered 2023-04-18: 75 mL via INTRAVENOUS

## 2023-04-29 ENCOUNTER — Other Ambulatory Visit: Payer: Self-pay | Admitting: Medical Oncology

## 2023-04-29 DIAGNOSIS — R911 Solitary pulmonary nodule: Secondary | ICD-10-CM

## 2023-04-30 ENCOUNTER — Inpatient Hospital Stay: Payer: 59

## 2023-04-30 ENCOUNTER — Inpatient Hospital Stay: Payer: 59 | Attending: Internal Medicine | Admitting: Internal Medicine

## 2023-04-30 VITALS — BP 142/83 | HR 97 | Temp 97.0°F | Resp 16 | Wt 131.0 lb

## 2023-04-30 DIAGNOSIS — E559 Vitamin D deficiency, unspecified: Secondary | ICD-10-CM | POA: Insufficient documentation

## 2023-04-30 DIAGNOSIS — G8929 Other chronic pain: Secondary | ICD-10-CM | POA: Diagnosis not present

## 2023-04-30 DIAGNOSIS — R911 Solitary pulmonary nodule: Secondary | ICD-10-CM | POA: Diagnosis not present

## 2023-04-30 DIAGNOSIS — Z808 Family history of malignant neoplasm of other organs or systems: Secondary | ICD-10-CM | POA: Insufficient documentation

## 2023-04-30 DIAGNOSIS — Z803 Family history of malignant neoplasm of breast: Secondary | ICD-10-CM | POA: Insufficient documentation

## 2023-04-30 DIAGNOSIS — K219 Gastro-esophageal reflux disease without esophagitis: Secondary | ICD-10-CM | POA: Diagnosis not present

## 2023-04-30 DIAGNOSIS — C3490 Malignant neoplasm of unspecified part of unspecified bronchus or lung: Secondary | ICD-10-CM

## 2023-04-30 LAB — CBC WITH DIFFERENTIAL (CANCER CENTER ONLY)
Abs Immature Granulocytes: 0.02 10*3/uL (ref 0.00–0.07)
Basophils Absolute: 0.1 10*3/uL (ref 0.0–0.1)
Basophils Relative: 1 %
Eosinophils Absolute: 0.1 10*3/uL (ref 0.0–0.5)
Eosinophils Relative: 2 %
HCT: 40.8 % (ref 36.0–46.0)
Hemoglobin: 13.7 g/dL (ref 12.0–15.0)
Immature Granulocytes: 0 %
Lymphocytes Relative: 34 %
Lymphs Abs: 2 10*3/uL (ref 0.7–4.0)
MCH: 31 pg (ref 26.0–34.0)
MCHC: 33.6 g/dL (ref 30.0–36.0)
MCV: 92.3 fL (ref 80.0–100.0)
Monocytes Absolute: 0.4 10*3/uL (ref 0.1–1.0)
Monocytes Relative: 7 %
Neutro Abs: 3.3 10*3/uL (ref 1.7–7.7)
Neutrophils Relative %: 56 %
Platelet Count: 205 10*3/uL (ref 150–400)
RBC: 4.42 MIL/uL (ref 3.87–5.11)
RDW: 12.3 % (ref 11.5–15.5)
WBC Count: 6 10*3/uL (ref 4.0–10.5)
nRBC: 0 % (ref 0.0–0.2)

## 2023-04-30 LAB — CMP (CANCER CENTER ONLY)
ALT: 22 U/L (ref 0–44)
AST: 19 U/L (ref 15–41)
Albumin: 4.7 g/dL (ref 3.5–5.0)
Alkaline Phosphatase: 62 U/L (ref 38–126)
Anion gap: 9 (ref 5–15)
BUN: 8 mg/dL (ref 8–23)
CO2: 26 mmol/L (ref 22–32)
Calcium: 9.6 mg/dL (ref 8.9–10.3)
Chloride: 106 mmol/L (ref 98–111)
Creatinine: 0.74 mg/dL (ref 0.44–1.00)
GFR, Estimated: >60 mL/min (ref >60)
Glucose, Bld: 111 mg/dL — ABNORMAL HIGH (ref 70–99)
Potassium: 3.5 mmol/L (ref 3.5–5.1)
Sodium: 141 mmol/L (ref 135–145)
Total Bilirubin: 0.8 mg/dL (ref 0.3–1.2)
Total Protein: 7.5 g/dL (ref 6.5–8.1)

## 2023-04-30 NOTE — Progress Notes (Signed)
NN met pt face to face today at her consult with Dr Arbutus Ped. Pt was accompanied by her husband, Onalee Hua. The plan for the pt is to refer the pt to CT Surgery. She will also need a PET scan, brain MRI, and PFTs. NN explained the role of a NN and provided contact information. Pt encouraged to reach out with any questions or concerns.  NN was able to schedule the brain MRI for 8am on 5/30 at [facility].  NN reached out via email to RTs Ruthell Rummage and Lorinda Creed to schedule the pts PFTs.  Per central scheduling, there isn't a PET scan at [facility] until 6/7, however there are appts available at [facility]. NN will reach out to the pt to confirm if she is willing to travel to [facility] to get her PET scan completed.

## 2023-04-30 NOTE — Progress Notes (Signed)
[facility] [phone]   [phone]  CONSULT NOTE  REFERRING PHYSICIAN: Dr. Hillard Danker  REASON FOR CONSULTATION:  62 years old white female with suspicious lung nodule  HPI [name] is a 62 y.o. female never smoker with past medical history significant for myalgia, myositis, vitamin D deficiency, chronic back pain, and GERD.  The patient was seen by her primary care provider in March 2024 complaining of cough that has been persistent for 1 week.  She had chest x-ray performed at that time and that showed a 1.7 cm radiopacity in the right apex that was not seen on the previous examination.  Repeat chest x-ray on 04/04/2023 showed continued presence of the right apical nodular density concerning for neoplasm and CT scan of the chest was recommended.  The had a CT scan of the chest on Apr 18, 2023 and that showed a 1.8 x 1.6 x 1.6 cm irregular spiculated nodule in the superior aspect of the right upper lobe corresponding to the persistent apical nodular density seen radiographically.  This is highly suspicious for a primary lung carcinoma.  There was also multiple small nonspecific ground glass nodular opacity in the left upper lobe and more vague areas of minimal ground glass nodularity in the right upper lobe that could be infectious or inflammatory in nature but metastatic disease is less likely.  The scan also showed 0.8 cm rounded mass in the central right lobe of the liver that is too small to characterize and likely cyst, metastasis or hemangioma but a cyst is favored. The patient was referred to me today for evaluation and recommendation regarding treatment of her condition. When seen today she is feeling fine with no concerning complaints.  She denied having any chest pain, shortness of breath, cough or hemoptysis.  She has no nausea, vomiting, diarrhea or constipation.  She has no headache or visual changes.  She denied having any significant weight  loss or night sweats. Family history significant for mother who died at age 21 with dementia and hypertension.  Father had salivary gland cancer and maternal aunt had breast cancer. The patient is married and has 1 son.  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  She was accompanied today by her husband.  She has no history for smoking, alcohol or drug abuse. HPI  Past Medical History:  Diagnosis Date   Back pain    GERD (gastroesophageal reflux disease)    Headache(784.0)    Hx: of sinus headaches   Myalgia    Myositis    Vitamin D deficiency disease     Past Surgical History:  Procedure Laterality Date   CHOLECYSTECTOMY N/A 09/24/2013   Procedure: LAPAROSCOPIC CHOLECYSTECTOMY;  Surgeon: Shelly Rubenstein, MD;  Location: [facility];  Service: General;  Laterality: N/A;   colonscopy     DILATION AND CURETTAGE OF UTERUS     EYE SURGERY      Family History  Problem Relation Age of Onset   Breast cancer Maternal Aunt 69    Social History Social History   Tobacco Use   Smoking status: Never   Smokeless tobacco: Never  Substance Use Topics   Alcohol use: No   Drug use: No    Allergies  Allergen Reactions   Darvocet [Propoxyphene N-Acetaminophen]     hallucinations   Hydrocodone-Acetaminophen Nausea And Vomiting   Peanuts [Nuts] Other (See Comments)    All nuts. Shortness of breath  Current Outpatient Medications  Medication Sig Dispense Refill   cholecalciferol (VITAMIN D) 1000 units tablet Take 1,000 Units by mouth daily.     Cholecalciferol (VITAMIN D3) 25 MCG (1000 UT) CAPS Take 1 capsule by mouth daily.     EPINEPHrine 0.3 mg/0.3 mL IJ SOAJ injection Inject 0.3 mg into the muscle daily as needed (allergic reaction).     naproxen (NAPROSYN) 500 MG tablet Take 1 tablet (500 mg total) by mouth 2 (two) times daily. 30 tablet 0   ondansetron (ZOFRAN) 4 MG/5ML solution Take 5 mLs (4 mg total) by mouth 2 (two) times daily as needed for nausea. 50  mL 0   Sulfamethoxazole-Trimethoprim (SULFAMETHOXAZOLE-TMP DS PO) Take by mouth.     tobramycin-dexamethasone (TOBRADEX) ophthalmic solution every 4 (four) hours while awake.     No current facility-administered medications for this visit.    Review of Systems  Constitutional: negative Eyes: negative Ears, nose, mouth, throat, and face: negative Respiratory: negative Cardiovascular: negative Gastrointestinal: negative Genitourinary:negative Integument/breast: negative Hematologic/lymphatic: negative Musculoskeletal:negative Neurological: negative Behavioral/Psych: negative Endocrine: negative Allergic/Immunologic: negative  Physical Exam  ZOX:WRUEA, healthy, no distress, well nourished, well developed, and anxious SKIN: skin color, texture, turgor are normal, no rashes or significant lesions HEAD: Normocephalic, No masses, lesions, tenderness or abnormalities EYES: normal, PERRLA, Conjunctiva are pink and non-injected EARS: External ears normal, Canals clear OROPHARYNX:no exudate, no erythema, and lips, buccal mucosa, and tongue normal  NECK: supple, no adenopathy, no JVD LYMPH:  no palpable lymphadenopathy, no hepatosplenomegaly BREAST:not examined LUNGS: clear to auscultation , and palpation HEART: regular rate & rhythm, no murmurs, and no gallops ABDOMEN:abdomen soft, non-tender, normal bowel sounds, and no masses or organomegaly BACK: Back symmetric, no curvature., No CVA tenderness EXTREMITIES:no joint deformities, effusion, or inflammation, no edema  NEURO: alert & oriented x 3 with fluent speech, no focal motor/sensory deficits  PERFORMANCE STATUS: ECOG 0  LABORATORY DATA: Lab Results  Component Value Date   WBC 6.2 07/13/2016   HGB 12.6 07/13/2016   HCT 37.9 07/13/2016   MCV 92.9 07/13/2016   PLT 165 07/13/2016      Chemistry      Component Value Date/Time   NA 139 07/13/2016 2220   K 3.6 07/13/2016 2220   CL 107 07/13/2016 2220   CO2 25 07/13/2016  2220   BUN 9 07/13/2016 2220   CREATININE 0.81 07/13/2016 2220      Component Value Date/Time   CALCIUM 9.3 07/13/2016 2220   ALKPHOS 57 09/20/2013 0110   AST 31 09/20/2013 0110   ALT 47 (H) 09/20/2013 0110   BILITOT 0.8 09/20/2013 0110       RADIOGRAPHIC STUDIES: CT CHEST W CONTRAST  Addendum Date: 04/22/2023   ADDENDUM REPORT: 04/22/2023 22:51 ADDENDUM: Recommend PET-CT and/or tissue sampling of the 1.8 cm right upper lobe nodule. Electronically Signed   By: Beckie Salts M.D.   On: 04/22/2023 22:51   Result Date: 04/22/2023 CLINICAL DATA:  Persistent right apical nodular density on recent chest radiographs. EXAM: CT CHEST WITH CONTRAST TECHNIQUE: Multidetector CT imaging of the chest was performed during intravenous contrast administration. RADIATION DOSE REDUCTION: This exam was performed according to the departmental dose-optimization program which includes automated exposure control, adjustment of the mA and/or kV according to patient size and/or use of iterative reconstruction technique. CONTRAST:  75mL ISOVUE-300 IOPAMIDOL (ISOVUE-300) INJECTION 61% COMPARISON:  Chest radiographs dated 04/04/2023 and 03/04/2023. FINDINGS: Cardiovascular: Minimal atheromatous aortic and coronary artery calcifications. Normal sized heart. There is some lower density blood  in the left upper lobe pulmonary artery simulating a pulmonary embolism in the axial plane. This does not appear to represent a pulmonary embolism in the sagittal coronal plane. Mediastinum/Nodes: Multiple subcentimeter nodules in a normal sized thyroid gland. Not clinically significant; no follow-up imaging recommended (ref: J Am Coll Radiol. 2015 Feb;12(2): 143-50). No enlarged lymph nodes. Normal sized heart. Lungs/Pleura: There is an irregular spiculated nodule in the superior aspect of the right upper lobe, corresponding to the persistent apical nodular density seen radiographically. This measures 1.8 x 1.6 cm on image number 22/3. This  measures 1.7 cm in length in the coronal plane with adjacent pleural tethering. There are 4 subcentimeter ground glass nodular opacities in the left upper lobe, 2 on image number 29/3 and 2 on image number 22/3. A 3 mm similar nodular density is demonstrated in the left upper lobe on image number 35/3. There are similar, more vague areas of minimal ground-glass nodularity in the right upper lobe posteriorly as well as additional 1 mm and 3 mm more discrete nodules in the right upper lobe on images 41-45/3. Mild biapical pleural and parenchymal scarring. No pleural fluid. Upper Abdomen: Diffuse low density of the liver. 8 mm rounded mass in the central right lobe of the liver on image number 120/2. This has a mean density measurement of 17 Hounsfield units. Normal-appearing adrenal glands. Musculoskeletal: Thoracic spine degenerative changes. No evidence of bony metastatic disease. IMPRESSION: 1. 1.8 x 1.6 x 1.6 cm irregular spiculated nodule in the superior aspect of the right upper lobe, corresponding to the persistent apical nodular density seen radiographically. This is highly suspicious for a primary lung carcinoma. 2. Multiple small, nonspecific ground-glass nodular opacities in the left upper lobe and more vague areas of minimal ground-glass nodularity in the right upper lobe. These could be infectious or inflammatory in nature. Metastatic disease is less likely. 3. Diffuse hepatic steatosis. 4. 8 mm rounded mass in the central right lobe of the liver. This is too small to characterize and could represent a cyst, metastasis or hemangioma. A cyst is favored. 5. Minimal calcific coronary artery and aortic atherosclerosis. These results will be called to the ordering clinician or representative by the Radiologist Assistant, and communication documented in the PACS or [employer]. Aortic Atherosclerosis (ICD10-I70.0). Electronically Signed: By: Beckie Salts M.D. On: 04/22/2023 22:35   DG Chest 2  View  Result Date: 04/09/2023 CLINICAL DATA:  Pneumonia. EXAM: CHEST - 2 VIEW COMPARISON:  March 04, 2023. FINDINGS: The heart size and mediastinal contours are within normal limits. Continued presence of right apical nodular density is noted highly concerning for neoplasm. Left lung is clear. The visualized skeletal structures are unremarkable. IMPRESSION: Continued presence of right apical nodular density is noted concerning for neoplasm. CT scan of the chest is recommended for further evaluation. These results will be called to the ordering clinician or representative by the Radiologist Assistant, and communication documented in the PACS or [employer]. Electronically Signed   By: Lupita Raider M.D.   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   PLAN: I had a lengthy discussion with the patient and her husband today about her current disease stage, prognosis and treatment options. I personally and independently reviewed the scan images and discussed the result and showed the images  to the patient and her husband. I recommended for the patient to complete the staging workup by ordering a PET scan as well as MRI of the brain to rule out any other metastatic disease. I will also arrange for the patient to have pulmonary function test. I recommended for the patient to see cardiothoracic surgery for surgical resection of the highly suspicious for primary bronchogenic carcinoma especially if the PET scan is positive. The patient is interested in the surgical option and I referred her to Dr. Dorris Fetch for evaluation and discussion of her treatment. I will see her back for follow-up visit after her surgery for discussion of any adjuvant therapy or monitoring if needed. The patient was advised to call  immediately if she has any other concerning symptoms in the interval. The patient voices understanding of current disease status and treatment options and is in agreement with the current care plan.  All questions were answered. The patient knows to call the clinic with any problems, questions or concerns. We can certainly see the patient much sooner if necessary.  Thank you so much for allowing me to participate in the care of Kathleen Roman. I will continue to follow up the patient with you and assist in her care.  The total time spent in the appointment was 60 minutes.  Disclaimer: This note was dictated with voice recognition software. Similar sounding words can inadvertently be transcribed and may not be corrected upon review.   Lajuana Matte Apr 30, 2023, 1:37 PM

## 2023-05-01 NOTE — Progress Notes (Signed)
NN reached out to pt to let her know that her PET scan has been scheduled for 6/13 at 9:20 at [facility]. Pt is aware that she is to go to the main entrance at 9am and can't have anything but water after 3am that morning. Pt verbalizes understanding of the instructions given.

## 2023-05-07 ENCOUNTER — Ambulatory Visit (HOSPITAL_COMMUNITY)
Admission: RE | Admit: 2023-05-07 | Discharge: 2023-05-07 | Disposition: A | Discharge status: Home / Self Care | Payer: 59 | Source: Ambulatory Visit | Attending: Internal Medicine | Admitting: Internal Medicine

## 2023-05-07 DIAGNOSIS — C3490 Malignant neoplasm of unspecified part of unspecified bronchus or lung: Secondary | ICD-10-CM | POA: Insufficient documentation

## 2023-05-07 LAB — PULMONARY FUNCTION TEST
DL/VA % pred: 102 %
DL/VA: 4.35 ml/min/mmHg/L
DLCO unc % pred: 104 %
DLCO unc: 19.27 ml/min/mmHg
FEF 25-75 Post: 2.83 L/sec
FEF 25-75 Pre: 3.02 L/sec
FEF2575-%Change-Post: -6 %
FEF2575-%Pred-Post: 138 %
FEF2575-%Pred-Pre: 147 %
FEV1-%Change-Post: -1 %
FEV1-%Pred-Post: 109 %
FEV1-%Pred-Pre: 111 %
FEV1-Post: 2.44 L
FEV1-Pre: 2.49 L
FEV1FVC-%Change-Post: 0 %
FEV1FVC-%Pred-Pre: 110 %
FEV6-%Change-Post: -2 %
FEV6-%Pred-Post: 101 %
FEV6-%Pred-Pre: 103 %
FEV6-Post: 2.83 L
FEV6-Pre: 2.91 L
FEV6FVC-%Pred-Post: 104 %
FEV6FVC-%Pred-Pre: 104 %
FVC-%Change-Post: -2 %
FVC-%Pred-Post: 97 %
FVC-%Pred-Pre: 99 %
FVC-Post: 2.83 L
FVC-Pre: 2.91 L
Post FEV1/FVC ratio: 86 %
Post FEV6/FVC ratio: 100 %
Pre FEV1/FVC ratio: 85 %
Pre FEV6/FVC Ratio: 100 %
RV % pred: 117 %
RV: 2.31 L
TLC % pred: 112 %
TLC: 5.33 L

## 2023-05-07 MED ORDER — ALBUTEROL SULFATE (2.5 MG/3ML) 0.083% IN NEBU
2.5000 mg | INHALATION_SOLUTION | Freq: Once | RESPIRATORY_TRACT | Status: AC
  Administered 2023-05-07: 2.5 mg via RESPIRATORY_TRACT

## 2023-05-09 ENCOUNTER — Ambulatory Visit (HOSPITAL_COMMUNITY): Payer: 59

## 2023-05-09 ENCOUNTER — Encounter (HOSPITAL_COMMUNITY): Payer: Self-pay

## 2023-05-11 ENCOUNTER — Ambulatory Visit (HOSPITAL_COMMUNITY)
Admission: RE | Admit: 2023-05-11 | Discharge: 2023-05-11 | Disposition: A | Discharge status: Home / Self Care | Payer: 59 | Source: Ambulatory Visit | Attending: Internal Medicine | Admitting: Internal Medicine

## 2023-05-11 DIAGNOSIS — C3490 Malignant neoplasm of unspecified part of unspecified bronchus or lung: Secondary | ICD-10-CM

## 2023-05-11 MED ORDER — GADOBUTROL 1 MMOL/ML IV SOLN
6.0000 mL | Freq: Once | INTRAVENOUS | Status: AC | PRN
  Administered 2023-05-11: 6 mL via INTRAVENOUS

## 2023-05-23 ENCOUNTER — Ambulatory Visit (HOSPITAL_COMMUNITY)
Admission: RE | Admit: 2023-05-23 | Discharge: 2023-05-23 | Disposition: A | Discharge status: Home / Self Care | Payer: 59 | Source: Ambulatory Visit | Attending: Internal Medicine | Admitting: Internal Medicine

## 2023-05-23 DIAGNOSIS — C3490 Malignant neoplasm of unspecified part of unspecified bronchus or lung: Secondary | ICD-10-CM | POA: Insufficient documentation

## 2023-05-23 LAB — GLUCOSE, CAPILLARY: Glucose-Capillary: 118 mg/dL — ABNORMAL HIGH (ref 70–99)

## 2023-05-23 MED ORDER — FLUDEOXYGLUCOSE F - 18 (FDG) INJECTION
7.5000 | Freq: Once | INTRAVENOUS | Status: AC
  Administered 2023-05-23: 6.45 via INTRAVENOUS

## 2023-05-29 ENCOUNTER — Institutional Professional Consult (permissible substitution): Payer: 59 | Admitting: Thoracic Surgery (Cardiothoracic Vascular Surgery)

## 2023-05-29 ENCOUNTER — Encounter: Payer: Self-pay | Admitting: Thoracic Surgery (Cardiothoracic Vascular Surgery)

## 2023-05-29 VITALS — BP 155/88 | HR 100 | Resp 20 | Ht 61.0 in | Wt 130.0 lb

## 2023-05-29 DIAGNOSIS — C3491 Malignant neoplasm of unspecified part of right bronchus or lung: Secondary | ICD-10-CM | POA: Diagnosis not present

## 2023-05-29 NOTE — Progress Notes (Signed)
PCP is Thana Ates, MD Referring Provider is Si Gaul, MD  Chief Complaint  Patient presents with   Lung Cancer    Surgical consult/ Chest CT 04/18/23/ PFT's 05/07/23/ MR Brain 05/11/23/ PET Scan 05/23/23    HPI: Mrs. Midgette is sent for consultation regarding a right upper lobe lung nodule.  Annika Orser is a 62 year old non-smoker with a past medical history significant for reflux, myalgias, myositis, back pain, and vitamin D deficiency.  Recently she presented to her primary physician with a cough.  A chest x-ray showed a right upper lobe opacity.  A follow-up chest x-ray a few weeks later showed the opacity was persistent.  She then had a CT which showed a 1.8 x 1.6 cm irregular spiculated nodule in the superior aspect of the right upper lobe.  There is no mediastinal or hilar adenopathy.  There was a very small lesion in the liver consistent with a cyst.  She was referred to Dr. Arbutus Ped.  He recommended surgical resection and referred her to me.  She had a PET/CT done, but that has not yet been read.  MRI of the brain showed no evidence of metastatic disease.  Other than the cough she has been feeling well.  She denies any chest pain, pressure, tightness, shortness of breath, change in appetite, weight loss, headaches, visual changes, or unusual bone or joint pain.  Zubrod Score: At the time of surgery this patient's most appropriate activity status/level should be described as: [x]     0    Normal activity, no symptoms []     1    Restricted in physical strenuous activity but ambulatory, able to do out light work []     2    Ambulatory and capable of self care, unable to do work activities, up and about >50 % of waking hours                              []     3    Only limited self care, in bed greater than 50% of waking hours []     4    Completely disabled, no self care, confined to bed or chair []     5    Moribund  Past Medical History:  Diagnosis Date   Back pain    GERD  (gastroesophageal reflux disease)    Headache(784.0)    Hx: of sinus headaches   Myalgia    Myositis    Vitamin D deficiency disease     Past Surgical History:  Procedure Laterality Date   CHOLECYSTECTOMY N/A 09/24/2013   Procedure: LAPAROSCOPIC CHOLECYSTECTOMY;  Surgeon: Shelly Rubenstein, MD;  Location: [facility] OR;  Service: General;  Laterality: N/A;   colonscopy     DILATION AND CURETTAGE OF UTERUS     EYE SURGERY      Family History  Problem Relation Age of Onset   Breast cancer Maternal Aunt 28    Social History Social History   Tobacco Use   Smoking status: Never   Smokeless tobacco: Never  Substance Use Topics   Alcohol use: No   Drug use: No    Current Outpatient Medications  Medication Sig Dispense Refill   Cholecalciferol (VITAMIN D3) 25 MCG (1000 UT) CAPS Take 1 capsule by mouth daily.     EPINEPHrine 0.3 mg/0.3 mL IJ SOAJ injection Inject 0.3 mg into the muscle daily as needed (allergic reaction).     No current  facility-administered medications for this visit.    Allergies  Allergen Reactions   Amoxicillin-Pot Clavulanate Nausea And Vomiting   Darvocet [Propoxyphene N-Acetaminophen]     hallucinations   Hydrocodone-Acetaminophen Nausea And Vomiting   Naproxen Other (See Comments)   Oxycodone     Other Reaction(s): Not available   Peanuts [Nuts] Other (See Comments)    All nuts. Shortness of breath    Review of Systems  Constitutional:  Negative for activity change, fatigue and unexpected weight change.  HENT:  Negative for trouble swallowing and voice change.   Eyes:  Negative for visual disturbance.  Respiratory:  Positive for cough. Negative for shortness of breath and wheezing.   Cardiovascular:  Negative for chest pain and leg swelling.  Gastrointestinal:  Negative for abdominal distention and abdominal pain.  Genitourinary:  Negative for difficulty urinating and dysuria.  Musculoskeletal:  Positive for myalgias. Negative for arthralgias.   Neurological:  Negative for syncope, weakness and headaches.  All other systems reviewed and are negative.   BP (!) 155/88   Pulse 100   Resp 20   Ht 5\' 1"  (1.549 m)   Wt 130 lb (59 kg)   SpO2 97% Comment: RA  BMI 24.56 kg/m  Physical Exam Vitals reviewed.  Constitutional:      General: She is not in acute distress.    Appearance: Normal appearance.  HENT:     Head: Normocephalic and atraumatic.  Eyes:     General: No scleral icterus.    Extraocular Movements: Extraocular movements intact.  Cardiovascular:     Rate and Rhythm: Normal rate and regular rhythm.     Heart sounds: Normal heart sounds. No murmur heard.    No friction rub. No gallop.  Pulmonary:     Effort: Pulmonary effort is normal. No respiratory distress.     Breath sounds: Normal breath sounds. No wheezing or rales.  Abdominal:     General: There is no distension.     Palpations: Abdomen is soft.  Skin:    General: Skin is warm and dry.  Neurological:     General: No focal deficit present.     Mental Status: She is alert and oriented to person, place, and time.     Cranial Nerves: No cranial nerve deficit.     Motor: No weakness.     Diagnostic Tests: CT CHEST WITH CONTRAST   TECHNIQUE: Multidetector CT imaging of the chest was performed during intravenous contrast administration.   RADIATION DOSE REDUCTION: This exam was performed according to the departmental dose-optimization program which includes automated exposure control, adjustment of the mA and/or kV according to patient size and/or use of iterative reconstruction technique.   CONTRAST:  75mL ISOVUE-300 IOPAMIDOL (ISOVUE-300) INJECTION 61%   COMPARISON:  Chest radiographs dated 04/04/2023 and 03/04/2023.   FINDINGS: Cardiovascular: Minimal atheromatous aortic and coronary artery calcifications. Normal sized heart. There is some lower density blood in the left upper lobe pulmonary artery simulating a pulmonary embolism in the  axial plane. This does not appear to represent a pulmonary embolism in the sagittal coronal plane.   Mediastinum/Nodes: Multiple subcentimeter nodules in a normal sized thyroid gland. Not clinically significant; no follow-up imaging recommended (ref: J Am Coll Radiol. 2015 Feb;12(2): 143-50).   No enlarged lymph nodes. Normal sized heart.   Lungs/Pleura: There is an irregular spiculated nodule in the superior aspect of the right upper lobe, corresponding to the persistent apical nodular density seen radiographically. This measures 1.8 x 1.6 cm on image  number 22/3. This measures 1.7 cm in length in the coronal plane with adjacent pleural tethering.   There are 4 subcentimeter ground glass nodular opacities in the left upper lobe, 2 on image number 29/3 and 2 on image number 22/3. A 3 mm similar nodular density is demonstrated in the left upper lobe on image number 35/3.   There are similar, more vague areas of minimal ground-glass nodularity in the right upper lobe posteriorly as well as additional 1 mm and 3 mm more discrete nodules in the right upper lobe on images 41-45/3.   Mild biapical pleural and parenchymal scarring. No pleural fluid.   Upper Abdomen: Diffuse low density of the liver. 8 mm rounded mass in the central right lobe of the liver on image number 120/2. This has a mean density measurement of 17 Hounsfield units. Normal-appearing adrenal glands.   Musculoskeletal: Thoracic spine degenerative changes. No evidence of bony metastatic disease.   IMPRESSION: 1. 1.8 x 1.6 x 1.6 cm irregular spiculated nodule in the superior aspect of the right upper lobe, corresponding to the persistent apical nodular density seen radiographically. This is highly suspicious for a primary lung carcinoma. 2. Multiple small, nonspecific ground-glass nodular opacities in the left upper lobe and more vague areas of minimal ground-glass nodularity in the right upper lobe. These could be  infectious or inflammatory in nature. Metastatic disease is less likely. 3. Diffuse hepatic steatosis. 4. 8 mm rounded mass in the central right lobe of the liver. This is too small to characterize and could represent a cyst, metastasis or hemangioma. A cyst is favored. 5. Minimal calcific coronary artery and aortic atherosclerosis.   These results will be called to the ordering clinician or representative by the Radiologist Assistant, and communication documented in the PACS or [employer].   Aortic Atherosclerosis (ICD10-I70.0).   Electronically Signed: By: Beckie Salts M.D. On: 04/22/2023 22:35 I personally reviewed the CT images.  1.8 cm spiculated nodule superior segment right upper lobe with pleural tail.  Suspicious for primary lung cancer.  No mediastinal or hilar adenopathy.  I reviewed the PET/CT images.  The right upper lobe nodule has significant hypermetabolic activity.  No mediastinal or hilar adenopathy or distant metastatic disease apparent.  Await official radiology reading.  Pulmonary function testing FVC 2.91 (99%) FEV1 2.49 (111%) No change with bronchodilators DLCO 19.27 (104%)  Impression: Carmell Dupin is a 62 year old non-smoker who presented with a cough and was found to have a right upper lobe lung nodule.  It is a solid spiculated nodule measuring about 1.8 cm.  Markedly hypermetabolic on PET.  Differential diagnosis includes primary bronchogenic carcinoma, carcinoid tumor, and granulomatous disease.  Other tumors are also possible but are less likely.  She needs both definitive diagnosis and treatment.  Given the high index of suspicion I recommended that we proceed directly to surgical resection.  The plan would be to do a wedge resection and obtain a frozen section.  If non-small cell carcinoma would need a right upper lobectomy, which would be done at the same setting.  I discussed the proposed operative procedure with Mrs. Penman and her  husband.  I informed them of the general nature of the procedure including the need for general anesthesia, the incisions to be used, the use of the surgical robot, the use of drains to postoperatively, the expected hospital stay, and the overall recovery.  I informed them of the indications, risks, benefits, and alternatives.  They understand the risks include, but not  limited to death, MI, DVT, PE, bleeding, possible need for transfusion, infection, possible need for conversion to open procedure, prolonged air leak, cardiac arrhythmias, as well as the possibility of other procedural complications.  She understands and accepts the risks and agrees to proceed.   Plan: Robotic right upper lobe wedge resection possible right upper lobectomy on Friday, 06/07/2023  Loreli Slot, MD [facility] [phone]

## 2023-05-29 NOTE — H&P (View-Only) (Signed)
PCP is Raju, Sneha P, MD Referring Provider is Mohamed, Mohamed, MD  Chief Complaint  Patient presents with   Lung Cancer    Surgical consult/ Chest CT 04/18/23/ PFT's 05/07/23/ MR Brain 05/11/23/ PET Scan 05/23/23    HPI: Kathleen Roman is sent for consultation regarding a right upper lobe lung nodule.  Kathleen Roman is a 62-year-old non-smoker with a past medical history significant for reflux, myalgias, myositis, back pain, and vitamin D deficiency.  Recently she presented to her primary physician with a cough.  A chest x-ray showed a right upper lobe opacity.  A follow-up chest x-ray a few weeks later showed the opacity was persistent.  She then had a CT which showed a 1.8 x 1.6 cm irregular spiculated nodule in the superior aspect of the right upper lobe.  There is no mediastinal or hilar adenopathy.  There was a very small lesion in the liver consistent with a cyst.  She was referred to Dr. Mohamed.  He recommended surgical resection and referred her to me.  She had a PET/CT done, but that has not yet been read.  MRI of the brain showed no evidence of metastatic disease.  Other than the cough she has been feeling well.  She denies any chest pain, pressure, tightness, shortness of breath, change in appetite, weight loss, headaches, visual changes, or unusual bone or joint pain.  Zubrod Score: At the time of surgery this patient's most appropriate activity status/level should be described as: [x]    0    Normal activity, no symptoms []    1    Restricted in physical strenuous activity but ambulatory, able to do out light work []    2    Ambulatory and capable of self care, unable to do work activities, up and about >50 % of waking hours                              []    3    Only limited self care, in bed greater than 50% of waking hours []    4    Completely disabled, no self care, confined to bed or chair []    5    Moribund  Past Medical History:  Diagnosis Date   Back pain    GERD  (gastroesophageal reflux disease)    Headache(784.0)    Hx: of sinus headaches   Myalgia    Myositis    Vitamin D deficiency disease     Past Surgical History:  Procedure Laterality Date   CHOLECYSTECTOMY N/A 09/24/2013   Procedure: LAPAROSCOPIC CHOLECYSTECTOMY;  Surgeon: Douglas A Blackman, MD;  Location: [facility] OR;  Service: General;  Laterality: N/A;   colonscopy     DILATION AND CURETTAGE OF UTERUS     EYE SURGERY      Family History  Problem Relation Age of Onset   Breast cancer Maternal Aunt 40    Social History Social History   Tobacco Use   Smoking status: Never   Smokeless tobacco: Never  Substance Use Topics   Alcohol use: No   Drug use: No    Current Outpatient Medications  Medication Sig Dispense Refill   Cholecalciferol (VITAMIN D3) 25 MCG (1000 UT) CAPS Take 1 capsule by mouth daily.     EPINEPHrine 0.3 mg/0.3 mL IJ SOAJ injection Inject 0.3 mg into the muscle daily as needed (allergic reaction).     No current   facility-administered medications for this visit.    Allergies  Allergen Reactions   Amoxicillin-Pot Clavulanate Nausea And Vomiting   Darvocet [Propoxyphene N-Acetaminophen]     hallucinations   Hydrocodone-Acetaminophen Nausea And Vomiting   Naproxen Other (See Comments)   Oxycodone     Other Reaction(s): Not available   Peanuts [Nuts] Other (See Comments)    All nuts. Shortness of breath    Review of Systems  Constitutional:  Negative for activity change, fatigue and unexpected weight change.  HENT:  Negative for trouble swallowing and voice change.   Eyes:  Negative for visual disturbance.  Respiratory:  Positive for cough. Negative for shortness of breath and wheezing.   Cardiovascular:  Negative for chest pain and leg swelling.  Gastrointestinal:  Negative for abdominal distention and abdominal pain.  Genitourinary:  Negative for difficulty urinating and dysuria.  Musculoskeletal:  Positive for myalgias. Negative for arthralgias.   Neurological:  Negative for syncope, weakness and headaches.  All other systems reviewed and are negative.   BP (!) 155/88   Pulse 100   Resp 20   Ht 5' 1" (1.549 m)   Wt 130 lb (59 kg)   SpO2 97% Comment: RA  BMI 24.56 kg/m  Physical Exam Vitals reviewed.  Constitutional:      General: She is not in acute distress.    Appearance: Normal appearance.  HENT:     Head: Normocephalic and atraumatic.  Eyes:     General: No scleral icterus.    Extraocular Movements: Extraocular movements intact.  Cardiovascular:     Rate and Rhythm: Normal rate and regular rhythm.     Heart sounds: Normal heart sounds. No murmur heard.    No friction rub. No gallop.  Pulmonary:     Effort: Pulmonary effort is normal. No respiratory distress.     Breath sounds: Normal breath sounds. No wheezing or rales.  Abdominal:     General: There is no distension.     Palpations: Abdomen is soft.  Skin:    General: Skin is warm and dry.  Neurological:     General: No focal deficit present.     Mental Status: She is alert and oriented to person, place, and time.     Cranial Nerves: No cranial nerve deficit.     Motor: No weakness.     Diagnostic Tests: CT CHEST WITH CONTRAST   TECHNIQUE: Multidetector CT imaging of the chest was performed during intravenous contrast administration.   RADIATION DOSE REDUCTION: This exam was performed according to the departmental dose-optimization program which includes automated exposure control, adjustment of the mA and/or kV according to patient size and/or use of iterative reconstruction technique.   CONTRAST:  75mL ISOVUE-300 IOPAMIDOL (ISOVUE-300) INJECTION 61%   COMPARISON:  Chest radiographs dated 04/04/2023 and 03/04/2023.   FINDINGS: Cardiovascular: Minimal atheromatous aortic and coronary artery calcifications. Normal sized heart. There is some lower density blood in the left upper lobe pulmonary artery simulating a pulmonary embolism in the  axial plane. This does not appear to represent a pulmonary embolism in the sagittal coronal plane.   Mediastinum/Nodes: Multiple subcentimeter nodules in a normal sized thyroid gland. Not clinically significant; no follow-up imaging recommended (ref: J Am Coll Radiol. 2015 Feb;12(2): 143-50).   No enlarged lymph nodes. Normal sized heart.   Lungs/Pleura: There is an irregular spiculated nodule in the superior aspect of the right upper lobe, corresponding to the persistent apical nodular density seen radiographically. This measures 1.8 x 1.6 cm on image   number 22/3. This measures 1.7 cm in length in the coronal plane with adjacent pleural tethering.   There are 4 subcentimeter ground glass nodular opacities in the left upper lobe, 2 on image number 29/3 and 2 on image number 22/3. A 3 mm similar nodular density is demonstrated in the left upper lobe on image number 35/3.   There are similar, more vague areas of minimal ground-glass nodularity in the right upper lobe posteriorly as well as additional 1 mm and 3 mm more discrete nodules in the right upper lobe on images 41-45/3.   Mild biapical pleural and parenchymal scarring. No pleural fluid.   Upper Abdomen: Diffuse low density of the liver. 8 mm rounded mass in the central right lobe of the liver on image number 120/2. This has a mean density measurement of 17 Hounsfield units. Normal-appearing adrenal glands.   Musculoskeletal: Thoracic spine degenerative changes. No evidence of bony metastatic disease.   IMPRESSION: 1. 1.8 x 1.6 x 1.6 cm irregular spiculated nodule in the superior aspect of the right upper lobe, corresponding to the persistent apical nodular density seen radiographically. This is highly suspicious for a primary lung carcinoma. 2. Multiple small, nonspecific ground-glass nodular opacities in the left upper lobe and more vague areas of minimal ground-glass nodularity in the right upper lobe. These could be  infectious or inflammatory in nature. Metastatic disease is less likely. 3. Diffuse hepatic steatosis. 4. 8 mm rounded mass in the central right lobe of the liver. This is too small to characterize and could represent a cyst, metastasis or hemangioma. A cyst is favored. 5. Minimal calcific coronary artery and aortic atherosclerosis.   These results will be called to the ordering clinician or representative by the Radiologist Assistant, and communication documented in the PACS or [employer].   Aortic Atherosclerosis (ICD10-I70.0).   Electronically Signed: By: Chavy Avera  Reid M.D. On: 04/22/2023 22:35 I personally reviewed the CT images.  1.8 cm spiculated nodule superior segment right upper lobe with pleural tail.  Suspicious for primary lung cancer.  No mediastinal or hilar adenopathy.  I reviewed the PET/CT images.  The right upper lobe nodule has significant hypermetabolic activity.  No mediastinal or hilar adenopathy or distant metastatic disease apparent.  Await official radiology reading.  Pulmonary function testing FVC 2.91 (99%) FEV1 2.49 (111%) No change with bronchodilators DLCO 19.27 (104%)  Impression: Kathleen Roman is a 62-year-old non-smoker who presented with a cough and was found to have a right upper lobe lung nodule.  It is a solid spiculated nodule measuring about 1.8 cm.  Markedly hypermetabolic on PET.  Differential diagnosis includes primary bronchogenic carcinoma, carcinoid tumor, and granulomatous disease.  Other tumors are also possible but are less likely.  She needs both definitive diagnosis and treatment.  Given the high index of suspicion I recommended that we proceed directly to surgical resection.  The plan would be to do a wedge resection and obtain a frozen section.  If non-small cell carcinoma would need a right upper lobectomy, which would be done at the same setting.  I discussed the proposed operative procedure with Kathleen Roman and her  husband.  I informed them of the general nature of the procedure including the need for general anesthesia, the incisions to be used, the use of the surgical robot, the use of drains to postoperatively, the expected hospital stay, and the overall recovery.  I informed them of the indications, risks, benefits, and alternatives.  They understand the risks include, but not   limited to death, MI, DVT, PE, bleeding, possible need for transfusion, infection, possible need for conversion to open procedure, prolonged air leak, cardiac arrhythmias, as well as the possibility of other procedural complications.  She understands and accepts the risks and agrees to proceed.   Plan: Robotic right upper lobe wedge resection possible right upper lobectomy on Friday, 06/07/2023  Kathleen Chavana C Kourtland Coopman, MD [facility] [phone]  

## 2023-05-30 ENCOUNTER — Encounter: Payer: Self-pay | Admitting: *Deleted

## 2023-05-30 ENCOUNTER — Other Ambulatory Visit: Payer: Self-pay | Admitting: *Deleted

## 2023-05-30 DIAGNOSIS — C3491 Malignant neoplasm of unspecified part of right bronchus or lung: Secondary | ICD-10-CM

## 2023-06-04 NOTE — Pre-Procedure Instructions (Signed)
Surgical Instructions    Your procedure is scheduled on Friday, June 28th.  Report to [facility] Main Entrance "A" at 05:30 A.M., then check in with the Admitting office.  Call this number if you have problems the morning of surgery:  [phone]  If you have any questions prior to your surgery date call [phone]: Open Monday-Friday 8am-4pm If you experience any cold or flu symptoms such as cough, fever, chills, shortness of breath, etc. between now and your scheduled surgery, please notify us at the above number.     Remember:  Do not eat or drink after midnight the night before your surgery     Take these medicines the morning of surgery with A SIP OF WATER  EPINEPHrine- If needed   As of today, STOP taking any Aspirin (unless otherwise instructed by your surgeon) Aleve, Naproxen, Ibuprofen, Motrin, Advil, Goody's, BC's, all herbal medications, fish oil, and all vitamins.                     Do NOT Smoke (Tobacco/Vaping) for 24 hours prior to your procedure.  If you use a CPAP at night, you may bring your mask/headgear for your overnight stay.   Contacts, glasses, piercing's, hearing aid's, dentures or partials may not be worn into surgery, please bring cases for these belongings.    For patients admitted to the hospital, discharge time will be determined by your treatment team.   Patients discharged the day of surgery will not be allowed to drive home, and someone needs to stay with them for 24 hours.  SURGICAL WAITING ROOM VISITATION Patients having surgery or a procedure may have no more than 2 support people in the waiting area - these visitors may rotate.   Children under the age of 52 must have an adult with them who is not the patient. If the patient needs to stay at the hospital during part of their recovery, the visitor guidelines for inpatient rooms apply. Pre-op nurse will coordinate an appropriate time for 1 support person to accompany patient in pre-op.  This  support person may not rotate.   Please refer to the [facility] website for the visitor guidelines for Inpatients (after your surgery is over and you are in a regular room).    Special instructions:   [place]- Preparing For Surgery  Before surgery, you can play an important role. Because skin is not sterile, your skin needs to be as free of germs as possible. You can reduce the number of germs on your skin by washing with CHG (chlorahexidine gluconate) Soap before surgery.  CHG is an antiseptic cleaner which kills germs and bonds with the skin to continue killing germs even after washing.    Oral Hygiene is also important to reduce your risk of infection.  Remember - BRUSH YOUR TEETH THE MORNING OF SURGERY WITH YOUR REGULAR TOOTHPASTE  Please do not use if you have an allergy to CHG or antibacterial soaps. If your skin becomes reddened/irritated stop using the CHG.  Do not shave (including legs and underarms) for at least 48 hours prior to first CHG shower. It is OK to shave your face.  Please follow these instructions carefully.   Shower the NIGHT BEFORE SURGERY and the MORNING OF SURGERY  If you chose to wash your hair, wash your hair first as usual with your normal shampoo.  After you shampoo, rinse your hair and body thoroughly to remove the shampoo.  Use CHG Soap as you  would any other liquid soap. You can apply CHG directly to the skin and wash gently with a scrungie or a clean washcloth.   Apply the CHG Soap to your body ONLY FROM THE NECK DOWN.  Do not use on open wounds or open sores. Avoid contact with your eyes, ears, mouth and genitals (private parts). Wash Face and genitals (private parts)  with your normal soap.   Wash thoroughly, paying special attention to the area where your surgery will be performed.  Thoroughly rinse your body with warm water from the neck down.  DO NOT shower/wash with your normal soap after using and rinsing off the CHG Soap.  Pat yourself  dry with a CLEAN TOWEL.  Wear CLEAN PAJAMAS to bed the night before surgery  Place CLEAN SHEETS on your bed the night before your surgery  DO NOT SLEEP WITH PETS.   Day of Surgery: Take a shower with CHG soap. Do not wear jewelry or makeup Do not wear lotions, powders, perfumes/colognes, or deodorant. Do not shave 48 hours prior to surgery.  Men may shave face and neck. Do not bring valuables to the hospital.  [facility] is not responsible for any belongings or valuables. Do not wear nail polish, gel polish, artificial nails, or any other type of covering on natural nails (fingers and toes) If you have artificial nails or gel coating that need to be removed by a nail salon, please have this removed prior to surgery. Artificial nails or gel coating may interfere with anesthesia's ability to adequately monitor your vital signs. Wear Clean/Comfortable clothing the morning of surgery Remember to brush your teeth WITH YOUR REGULAR TOOTHPASTE.   Please read over the following fact sheets that you were given.    If you received a COVID test during your pre-op visit  it is requested that you wear a mask when out in public, stay away from anyone that may not be feeling well and notify your surgeon if you develop symptoms. If you have been in contact with anyone that has tested positive in the last 10 days please notify you surgeon.

## 2023-06-05 ENCOUNTER — Ambulatory Visit (HOSPITAL_COMMUNITY)
Admission: RE | Admit: 2023-06-05 | Discharge: 2023-06-05 | Disposition: A | Discharge status: Home / Self Care | Payer: 59 | Source: Ambulatory Visit | Attending: Thoracic Surgery (Cardiothoracic Vascular Surgery) | Admitting: Thoracic Surgery (Cardiothoracic Vascular Surgery)

## 2023-06-05 ENCOUNTER — Encounter (HOSPITAL_COMMUNITY)
Admission: RE | Admit: 2023-06-05 | Discharge: 2023-06-05 | Disposition: A | Discharge status: Home / Self Care | Payer: 59 | Source: Ambulatory Visit | Attending: Thoracic Surgery (Cardiothoracic Vascular Surgery) | Admitting: Thoracic Surgery (Cardiothoracic Vascular Surgery)

## 2023-06-05 ENCOUNTER — Encounter (HOSPITAL_COMMUNITY): Payer: Self-pay

## 2023-06-05 ENCOUNTER — Other Ambulatory Visit: Payer: Self-pay

## 2023-06-05 VITALS — BP 134/82 | HR 98 | Temp 98.4°F | Resp 18 | Ht 61.0 in | Wt 128.0 lb

## 2023-06-05 DIAGNOSIS — Z01818 Encounter for other preprocedural examination: Secondary | ICD-10-CM | POA: Insufficient documentation

## 2023-06-05 DIAGNOSIS — C3491 Malignant neoplasm of unspecified part of right bronchus or lung: Secondary | ICD-10-CM | POA: Insufficient documentation

## 2023-06-05 DIAGNOSIS — Z1152 Encounter for screening for COVID-19: Secondary | ICD-10-CM | POA: Insufficient documentation

## 2023-06-05 LAB — TYPE AND SCREEN
ABO/RH(D): O NEG
Antibody Screen: NEGATIVE

## 2023-06-05 LAB — URINALYSIS, ROUTINE W REFLEX MICROSCOPIC
Bilirubin Urine: NEGATIVE
Glucose, UA: NEGATIVE mg/dL
Hgb urine dipstick: NEGATIVE
Ketones, ur: NEGATIVE mg/dL
Leukocytes,Ua: NEGATIVE
Nitrite: NEGATIVE
Protein, ur: NEGATIVE mg/dL
Specific Gravity, Urine: 1.008 (ref 1.005–1.030)
pH: 5 (ref 5.0–8.0)

## 2023-06-05 LAB — CBC
HCT: 41.7 % (ref 36.0–46.0)
Hemoglobin: 14 g/dL (ref 12.0–15.0)
MCH: 31.4 pg (ref 26.0–34.0)
MCHC: 33.6 g/dL (ref 30.0–36.0)
MCV: 93.5 fL (ref 80.0–100.0)
Platelets: 189 10*3/uL (ref 150–400)
RBC: 4.46 MIL/uL (ref 3.87–5.11)
RDW: 12.1 % (ref 11.5–15.5)
WBC: 5.8 10*3/uL (ref 4.0–10.5)
nRBC: 0 % (ref 0.0–0.2)

## 2023-06-05 LAB — COMPREHENSIVE METABOLIC PANEL
ALT: 21 U/L (ref 0–44)
AST: 19 U/L (ref 15–41)
Albumin: 4.2 g/dL (ref 3.5–5.0)
Alkaline Phosphatase: 55 U/L (ref 38–126)
Anion gap: 9 (ref 5–15)
BUN: 7 mg/dL — ABNORMAL LOW (ref 8–23)
CO2: 19 mmol/L — ABNORMAL LOW (ref 22–32)
Calcium: 9.2 mg/dL (ref 8.9–10.3)
Chloride: 106 mmol/L (ref 98–111)
Creatinine, Ser: 0.65 mg/dL (ref 0.44–1.00)
GFR, Estimated: >60 mL/min (ref >60)
Glucose, Bld: 116 mg/dL — ABNORMAL HIGH (ref 70–99)
Potassium: 3.7 mmol/L (ref 3.5–5.1)
Sodium: 134 mmol/L — ABNORMAL LOW (ref 135–145)
Total Bilirubin: 0.9 mg/dL (ref 0.3–1.2)
Total Protein: 7.1 g/dL (ref 6.5–8.1)

## 2023-06-05 LAB — APTT: aPTT: 29 seconds (ref 24–36)

## 2023-06-05 LAB — BLOOD GAS, ARTERIAL
Acid-base deficit: 1 mmol/L (ref 0.0–2.0)
Bicarbonate: 23.5 mmol/L (ref 20.0–28.0)
Drawn by: 58793
O2 Saturation: 98.8 %
Patient temperature: 37
pCO2 arterial: 38 mmHg (ref 32–48)
pH, Arterial: 7.4 (ref 7.35–7.45)
pO2, Arterial: 95 mmHg (ref 83–108)

## 2023-06-05 LAB — PROTIME-INR
INR: 0.9 (ref 0.8–1.2)
Prothrombin Time: 12.6 seconds (ref 11.4–15.2)

## 2023-06-05 LAB — SURGICAL PCR SCREEN
MRSA, PCR: NEGATIVE
Staphylococcus aureus: NEGATIVE

## 2023-06-05 NOTE — Progress Notes (Signed)
PCP - Dr. Hillard Danker Cardiologist - denies  PPM/ICD - denies   Chest x-ray - 06/05/23 EKG - 06/05/23 Stress Test - denies ECHO - denies Cardiac Cath - denies  OSA - denies   DM- denies  ASA/Blood Thinner Instructions: n/a   ERAS Protcol - no, NPO   COVID TEST- 06/05/23   Anesthesia review: no  Patient denies shortness of breath, fever, cough and chest pain at PAT appointment   All instructions explained to the patient, with a verbal understanding of the material. Patient agrees to go over the instructions while at home for a better understanding. Patient also instructed to wear a mask in public after being tested for COVID-19. The opportunity to ask questions was provided.

## 2023-06-06 LAB — SARS CORONAVIRUS 2 (TAT 6-24 HRS): SARS Coronavirus 2: NEGATIVE

## 2023-06-07 ENCOUNTER — Other Ambulatory Visit: Payer: Self-pay

## 2023-06-07 ENCOUNTER — Inpatient Hospital Stay (HOSPITAL_COMMUNITY): Payer: 59 | Admitting: Certified Registered"

## 2023-06-07 ENCOUNTER — Encounter (HOSPITAL_COMMUNITY)
Admission: RE | Disposition: A | Discharge status: Home / Self Care | Payer: Self-pay | Source: Ambulatory Visit | Attending: Thoracic Surgery (Cardiothoracic Vascular Surgery)

## 2023-06-07 ENCOUNTER — Inpatient Hospital Stay (HOSPITAL_COMMUNITY)
Admission: RE | Admit: 2023-06-07 | Discharge: 2023-06-10 | DRG: 164 | Disposition: A | Discharge status: Home / Self Care | Payer: 59 | Attending: Thoracic Surgery (Cardiothoracic Vascular Surgery) | Admitting: Thoracic Surgery (Cardiothoracic Vascular Surgery)

## 2023-06-07 ENCOUNTER — Encounter (HOSPITAL_COMMUNITY): Payer: Self-pay | Admitting: Thoracic Surgery (Cardiothoracic Vascular Surgery)

## 2023-06-07 ENCOUNTER — Inpatient Hospital Stay (HOSPITAL_COMMUNITY): Payer: 59

## 2023-06-07 DIAGNOSIS — K7689 Other specified diseases of liver: Secondary | ICD-10-CM | POA: Diagnosis present

## 2023-06-07 DIAGNOSIS — C3491 Malignant neoplasm of unspecified part of right bronchus or lung: Secondary | ICD-10-CM

## 2023-06-07 DIAGNOSIS — Z803 Family history of malignant neoplasm of breast: Secondary | ICD-10-CM | POA: Diagnosis not present

## 2023-06-07 DIAGNOSIS — C782 Secondary malignant neoplasm of pleura: Secondary | ICD-10-CM | POA: Diagnosis present

## 2023-06-07 DIAGNOSIS — E559 Vitamin D deficiency, unspecified: Secondary | ICD-10-CM | POA: Diagnosis present

## 2023-06-07 DIAGNOSIS — R911 Solitary pulmonary nodule: Secondary | ICD-10-CM | POA: Diagnosis present

## 2023-06-07 DIAGNOSIS — Z1152 Encounter for screening for COVID-19: Secondary | ICD-10-CM

## 2023-06-07 DIAGNOSIS — Z9049 Acquired absence of other specified parts of digestive tract: Secondary | ICD-10-CM | POA: Diagnosis not present

## 2023-06-07 DIAGNOSIS — C3411 Malignant neoplasm of upper lobe, right bronchus or lung: Secondary | ICD-10-CM

## 2023-06-07 DIAGNOSIS — Z886 Allergy status to analgesic agent status: Secondary | ICD-10-CM | POA: Diagnosis not present

## 2023-06-07 DIAGNOSIS — M25511 Pain in right shoulder: Secondary | ICD-10-CM | POA: Diagnosis not present

## 2023-06-07 DIAGNOSIS — K219 Gastro-esophageal reflux disease without esophagitis: Secondary | ICD-10-CM | POA: Diagnosis present

## 2023-06-07 DIAGNOSIS — Z880 Allergy status to penicillin: Secondary | ICD-10-CM

## 2023-06-07 DIAGNOSIS — E876 Hypokalemia: Secondary | ICD-10-CM | POA: Diagnosis present

## 2023-06-07 DIAGNOSIS — Z902 Acquired absence of lung [part of]: Principal | ICD-10-CM

## 2023-06-07 DIAGNOSIS — Z885 Allergy status to narcotic agent status: Secondary | ICD-10-CM

## 2023-06-07 LAB — ABO/RH: ABO/RH(D): O NEG

## 2023-06-07 SURGERY — WEDGE RESECTION, LUNG, ROBOT-ASSISTED, THORACOSCOPIC
Anesthesia: General | Site: Chest | Laterality: Right

## 2023-06-07 MED ORDER — BUPIVACAINE HCL (PF) 0.5 % IJ SOLN
INTRAMUSCULAR | Status: AC
  Filled 2023-06-07: qty 30

## 2023-06-07 MED ORDER — ACETAMINOPHEN 160 MG/5ML PO SOLN
1000.0000 mg | Freq: Four times a day (QID) | ORAL | Status: DC
  Filled 2023-06-07: qty 40.6

## 2023-06-07 MED ORDER — BISACODYL 5 MG PO TBEC
10.0000 mg | DELAYED_RELEASE_TABLET | Freq: Every day | ORAL | Status: DC
  Administered 2023-06-08 – 2023-06-10 (×2): 10 mg via ORAL
  Filled 2023-06-07 (×3): qty 2

## 2023-06-07 MED ORDER — VANCOMYCIN HCL IN DEXTROSE 1-5 GM/200ML-% IV SOLN
1000.0000 mg | Freq: Two times a day (BID) | INTRAVENOUS | Status: DC
  Filled 2023-06-07: qty 200

## 2023-06-07 MED ORDER — BUPIVACAINE LIPOSOME 1.3 % IJ SUSP
INTRAMUSCULAR | Status: DC | PRN
  Administered 2023-06-07: 100 mL

## 2023-06-07 MED ORDER — DIPHENHYDRAMINE HCL 25 MG PO CAPS: 25.0000 mg | ORAL_CAPSULE | Freq: Once | ORAL | Status: AC

## 2023-06-07 MED ORDER — VANCOMYCIN HCL IN DEXTROSE 1-5 GM/200ML-% IV SOLN
1000.0000 mg | INTRAVENOUS | Status: AC
  Administered 2023-06-07: 1000 mg via INTRAVENOUS
  Filled 2023-06-07: qty 200

## 2023-06-07 MED ORDER — CHLORHEXIDINE GLUCONATE CLOTH 2 % EX PADS
6.0000 | MEDICATED_PAD | Freq: Every day | CUTANEOUS | Status: DC
  Administered 2023-06-07 – 2023-06-10 (×4): 6 via TOPICAL

## 2023-06-07 MED ORDER — PHENYLEPHRINE HCL-NACL 20-0.9 MG/250ML-% IV SOLN
INTRAVENOUS | Status: DC | PRN
  Administered 2023-06-07: 15 ug/min via INTRAVENOUS

## 2023-06-07 MED ORDER — LACTATED RINGERS IV SOLN: INTRAVENOUS | Status: DC

## 2023-06-07 MED ORDER — ROCURONIUM BROMIDE 10 MG/ML (PF) SYRINGE
PREFILLED_SYRINGE | INTRAVENOUS | Status: DC | PRN
  Administered 2023-06-07: 60 mg via INTRAVENOUS
  Administered 2023-06-07: 30 mg via INTRAVENOUS
  Administered 2023-06-07: 40 mg via INTRAVENOUS
  Administered 2023-06-07: 10 mg via INTRAVENOUS

## 2023-06-07 MED ORDER — ENOXAPARIN SODIUM 40 MG/0.4ML IJ SOSY
40.0000 mg | PREFILLED_SYRINGE | Freq: Every day | INTRAMUSCULAR | Status: DC
  Administered 2023-06-07 – 2023-06-09 (×3): 40 mg via SUBCUTANEOUS
  Filled 2023-06-07 (×3): qty 0.4

## 2023-06-07 MED ORDER — PROMETHAZINE HCL 25 MG/ML IJ SOLN: 6.2500 mg | INTRAMUSCULAR | Status: DC | PRN

## 2023-06-07 MED ORDER — ONDANSETRON HCL 4 MG/2ML IJ SOLN
INTRAMUSCULAR | Status: DC | PRN
  Administered 2023-06-07: 4 mg via INTRAVENOUS

## 2023-06-07 MED ORDER — ACETAMINOPHEN 10 MG/ML IV SOLN: 1000.0000 mg | Freq: Once | INTRAVENOUS | Status: DC | PRN

## 2023-06-07 MED ORDER — DIPHENHYDRAMINE HCL 25 MG PO CAPS
ORAL_CAPSULE | ORAL | Status: AC
  Administered 2023-06-07: 25 mg via ORAL
  Filled 2023-06-07: qty 1

## 2023-06-07 MED ORDER — AMISULPRIDE (ANTIEMETIC) 5 MG/2ML IV SOLN: 10.0000 mg | Freq: Once | INTRAVENOUS | Status: DC | PRN

## 2023-06-07 MED ORDER — 0.9 % SODIUM CHLORIDE (POUR BTL) OPTIME
TOPICAL | Status: DC | PRN
  Administered 2023-06-07: 2000 mL

## 2023-06-07 MED ORDER — KETOROLAC TROMETHAMINE 30 MG/ML IJ SOLN
INTRAMUSCULAR | Status: AC
  Filled 2023-06-07: qty 1

## 2023-06-07 MED ORDER — PANTOPRAZOLE SODIUM 40 MG PO TBEC
40.0000 mg | DELAYED_RELEASE_TABLET | Freq: Every day | ORAL | Status: DC
  Administered 2023-06-08 – 2023-06-10 (×3): 40 mg via ORAL
  Filled 2023-06-07 (×3): qty 1

## 2023-06-07 MED ORDER — ONDANSETRON HCL 4 MG/2ML IJ SOLN: 4.0000 mg | Freq: Four times a day (QID) | INTRAMUSCULAR | Status: DC | PRN

## 2023-06-07 MED ORDER — ACETAMINOPHEN 10 MG/ML IV SOLN
INTRAVENOUS | Status: AC
  Filled 2023-06-07: qty 100

## 2023-06-07 MED ORDER — HYDROMORPHONE HCL 1 MG/ML IJ SOLN
INTRAMUSCULAR | Status: AC
  Filled 2023-06-07: qty 1

## 2023-06-07 MED ORDER — SUGAMMADEX SODIUM 200 MG/2ML IV SOLN
INTRAVENOUS | Status: DC | PRN
  Administered 2023-06-07: 200 mg via INTRAVENOUS

## 2023-06-07 MED ORDER — VANCOMYCIN HCL IN DEXTROSE 1-5 GM/200ML-% IV SOLN
1000.0000 mg | Freq: Two times a day (BID) | INTRAVENOUS | Status: AC
  Administered 2023-06-07: 1000 mg via INTRAVENOUS
  Filled 2023-06-07: qty 200

## 2023-06-07 MED ORDER — ACETAMINOPHEN 10 MG/ML IV SOLN
INTRAVENOUS | Status: DC | PRN
  Administered 2023-06-07: 1000 mg via INTRAVENOUS

## 2023-06-07 MED ORDER — FENTANYL CITRATE (PF) 250 MCG/5ML IJ SOLN
INTRAMUSCULAR | Status: AC
  Filled 2023-06-07: qty 5

## 2023-06-07 MED ORDER — KETOROLAC TROMETHAMINE 30 MG/ML IJ SOLN
INTRAMUSCULAR | Status: DC | PRN
  Administered 2023-06-07: 30 mg via INTRAVENOUS

## 2023-06-07 MED ORDER — PROPOFOL 10 MG/ML IV BOLUS
INTRAVENOUS | Status: DC | PRN
  Administered 2023-06-07: 80 mg via INTRAVENOUS

## 2023-06-07 MED ORDER — ORAL CARE MOUTH RINSE: 15.0000 mL | Freq: Once | OROMUCOSAL | Status: AC

## 2023-06-07 MED ORDER — GABAPENTIN 300 MG PO CAPS
300.0000 mg | ORAL_CAPSULE | Freq: Every day | ORAL | Status: AC
  Administered 2023-06-07 – 2023-06-09 (×3): 300 mg via ORAL
  Filled 2023-06-07 (×3): qty 1

## 2023-06-07 MED ORDER — SENNOSIDES-DOCUSATE SODIUM 8.6-50 MG PO TABS
1.0000 | ORAL_TABLET | Freq: Every day | ORAL | Status: DC
  Administered 2023-06-07: 1 via ORAL
  Filled 2023-06-07 (×3): qty 1

## 2023-06-07 MED ORDER — MEPERIDINE HCL 25 MG/ML IJ SOLN: 6.2500 mg | INTRAMUSCULAR | Status: DC | PRN

## 2023-06-07 MED ORDER — CHLORHEXIDINE GLUCONATE 0.12 % MT SOLN
15.0000 mL | Freq: Once | OROMUCOSAL | Status: AC
  Administered 2023-06-07: 15 mL via OROMUCOSAL
  Filled 2023-06-07: qty 15

## 2023-06-07 MED ORDER — ACETAMINOPHEN 325 MG PO TABS: 325.0000 mg | ORAL_TABLET | Freq: Once | ORAL | Status: DC | PRN

## 2023-06-07 MED ORDER — HEMOSTATIC AGENTS (NO CHARGE) OPTIME
TOPICAL | Status: DC | PRN
  Administered 2023-06-07 (×2): 1 via TOPICAL

## 2023-06-07 MED ORDER — FENTANYL CITRATE (PF) 250 MCG/5ML IJ SOLN
INTRAMUSCULAR | Status: DC | PRN
  Administered 2023-06-07 (×3): 50 ug via INTRAVENOUS
  Administered 2023-06-07: 100 ug via INTRAVENOUS

## 2023-06-07 MED ORDER — BUPIVACAINE LIPOSOME 1.3 % IJ SUSP
INTRAMUSCULAR | Status: AC
  Filled 2023-06-07: qty 20

## 2023-06-07 MED ORDER — PHENYLEPHRINE 80 MCG/ML (10ML) SYRINGE FOR IV PUSH (FOR BLOOD PRESSURE SUPPORT)
PREFILLED_SYRINGE | INTRAVENOUS | Status: DC | PRN
  Administered 2023-06-07: 80 ug via INTRAVENOUS
  Administered 2023-06-07: 160 ug via INTRAVENOUS

## 2023-06-07 MED ORDER — DEXAMETHASONE SODIUM PHOSPHATE 10 MG/ML IJ SOLN
INTRAMUSCULAR | Status: DC | PRN
  Administered 2023-06-07: 10 mg via INTRAVENOUS

## 2023-06-07 MED ORDER — MORPHINE SULFATE (PF) 2 MG/ML IV SOLN: 2.0000 mg | INTRAVENOUS | Status: DC | PRN

## 2023-06-07 MED ORDER — TRAMADOL HCL 50 MG PO TABS
50.0000 mg | ORAL_TABLET | Freq: Four times a day (QID) | ORAL | Status: DC | PRN
  Administered 2023-06-08 (×2): 100 mg via ORAL
  Administered 2023-06-09 – 2023-06-10 (×2): 50 mg via ORAL
  Filled 2023-06-07 (×3): qty 1
  Filled 2023-06-07 (×2): qty 2

## 2023-06-07 MED ORDER — ACETAMINOPHEN 500 MG PO TABS
1000.0000 mg | ORAL_TABLET | Freq: Four times a day (QID) | ORAL | Status: DC
  Administered 2023-06-07 – 2023-06-10 (×11): 1000 mg via ORAL
  Filled 2023-06-07 (×11): qty 2

## 2023-06-07 MED ORDER — HYDROMORPHONE HCL 1 MG/ML IJ SOLN
0.2500 mg | INTRAMUSCULAR | Status: DC | PRN
  Administered 2023-06-07 (×2): 0.5 mg via INTRAVENOUS

## 2023-06-07 MED ORDER — GABAPENTIN 300 MG PO CAPS: 300.0000 mg | ORAL_CAPSULE | Freq: Two times a day (BID) | ORAL | Status: DC

## 2023-06-07 MED ORDER — PROPOFOL 10 MG/ML IV BOLUS
INTRAVENOUS | Status: AC
  Filled 2023-06-07: qty 20

## 2023-06-07 MED ORDER — ACETAMINOPHEN 160 MG/5ML PO SOLN: 325.0000 mg | Freq: Once | ORAL | Status: DC | PRN

## 2023-06-07 MED ORDER — MIDAZOLAM HCL 2 MG/2ML IJ SOLN
INTRAMUSCULAR | Status: AC
  Filled 2023-06-07: qty 2

## 2023-06-07 MED ORDER — MIDAZOLAM HCL 2 MG/2ML IJ SOLN
INTRAMUSCULAR | Status: DC | PRN
  Administered 2023-06-07 (×2): 1 mg via INTRAVENOUS

## 2023-06-07 SURGICAL SUPPLY — 73 items
ADH SKN CLS APL DERMABOND .7 (GAUZE/BANDAGES/DRESSINGS) ×1
BAG TISS RTRVL C300 12X14 (MISCELLANEOUS) ×1
CANISTER SUCT 3000ML PPV (MISCELLANEOUS) ×2 IMPLANT
CANNULA REDUCER 12-8 DVNC XI (CANNULA) ×2 IMPLANT
CNTNR URN SCR LID CUP LEK RST (MISCELLANEOUS) ×5 IMPLANT
CONN ST 1/4X3/8 BEN (MISCELLANEOUS) IMPLANT
CONT SPEC 4OZ STRL OR WHT (MISCELLANEOUS) ×15
DEFOGGER SCOPE WARMER CLEARIFY (MISCELLANEOUS) ×1 IMPLANT
DERMABOND ADVANCED .7 DNX12 (GAUZE/BANDAGES/DRESSINGS) ×1 IMPLANT
DRAIN CHANNEL 28F RND 3/8 FF (WOUND CARE) IMPLANT
DRAPE ARM DVNC X/XI (DISPOSABLE) ×4 IMPLANT
DRAPE COLUMN DVNC XI (DISPOSABLE) ×1 IMPLANT
DRAPE CV SPLIT W-CLR ANES SCRN (DRAPES) ×1 IMPLANT
DRAPE HALF SHEET 40X57 (DRAPES) ×1 IMPLANT
DRAPE ORTHO SPLIT 77X108 STRL (DRAPES) ×1
DRAPE SURG ORHT 6 SPLT 77X108 (DRAPES) ×1 IMPLANT
ELECT REM PT RETURN 9FT ADLT (ELECTROSURGICAL) ×1
ELECTRODE REM PT RTRN 9FT ADLT (ELECTROSURGICAL) ×1 IMPLANT
FORCEPS BPLR FENES DVNC XI (FORCEP) IMPLANT
FORCEPS BPLR LNG DVNC XI (INSTRUMENTS) IMPLANT
GAUZE KITTNER 4X5 RF (MISCELLANEOUS) ×2 IMPLANT
GAUZE SPONGE 4X4 12PLY STRL (GAUZE/BANDAGES/DRESSINGS) IMPLANT
GLOVE SS BIOGEL STRL SZ 7.5 (GLOVE) ×1 IMPLANT
GOWN STRL REUS W/ TWL XL LVL3 (GOWN DISPOSABLE) ×2 IMPLANT
GOWN STRL REUS W/TWL 2XL LVL3 (GOWN DISPOSABLE) ×1 IMPLANT
GOWN STRL REUS W/TWL XL LVL3 (GOWN DISPOSABLE) ×4
GRASPER TIP-UP FEN DVNC XI (INSTRUMENTS) IMPLANT
HEMOSTAT SURGICEL 2X14 (HEMOSTASIS) ×3 IMPLANT
IRRIGATION STRYKERFLOW (MISCELLANEOUS) ×1 IMPLANT
IRRIGATOR STRYKERFLOW (MISCELLANEOUS) ×1
KIT BASIN OR (CUSTOM PROCEDURE TRAY) ×1 IMPLANT
KIT TURNOVER KIT B (KITS) ×1 IMPLANT
NDL HYPO 25GX1X1/2 BEV (NEEDLE) ×1 IMPLANT
NDL SPNL 22GX3.5 QUINCKE BK (NEEDLE) ×1 IMPLANT
NEEDLE HYPO 25GX1X1/2 BEV (NEEDLE) ×1 IMPLANT
NEEDLE SPNL 22GX3.5 QUINCKE BK (NEEDLE) ×1 IMPLANT
NS IRRIG 1000ML POUR BTL (IV SOLUTION) ×1 IMPLANT
PACK CHEST (CUSTOM PROCEDURE TRAY) ×1 IMPLANT
PAD ARMBOARD 7.5X6 YLW CONV (MISCELLANEOUS) ×2 IMPLANT
PORT ACCESS TROCAR AIRSEAL 12 (TROCAR) ×1 IMPLANT
RELOAD STAPLE 45 2.5 WHT DVNC (STAPLE) IMPLANT
RELOAD STAPLE 45 3.5 BLU DVNC (STAPLE) IMPLANT
RELOAD STAPLE 45 4.3 GRN DVNC (STAPLE) IMPLANT
RELOAD STAPLE 45 4.6 BLK DVNC (STAPLE) IMPLANT
RELOAD STAPLER 2.5X45 WHT DVNC (STAPLE) ×3 IMPLANT
RELOAD STAPLER 3.5X45 BLU DVNC (STAPLE) ×6 IMPLANT
RELOAD STAPLER 4.3X45 GRN DVNC (STAPLE) ×6 IMPLANT
RELOAD STAPLER 45 4.6 BLK DVNC (STAPLE) ×3 IMPLANT
SEAL UNIV 5-12 XI (MISCELLANEOUS) ×4 IMPLANT
SOL ELECTROSURG ANTI STICK (MISCELLANEOUS) ×1
SOLUTION ELECTROSURG ANTI STCK (MISCELLANEOUS) ×1 IMPLANT
SPONGE TONSIL 1 RF SGL (DISPOSABLE) IMPLANT
STAPLER 45 SUREFORM DVNC (STAPLE) IMPLANT
STAPLER RELOAD 2.5X45 WHT DVNC (STAPLE) ×3
STAPLER RELOAD 3.5X45 BLU DVNC (STAPLE) ×6
STAPLER RELOAD 4.3X45 GRN DVNC (STAPLE) ×6
STAPLER RELOAD 45 4.6 BLK DVNC (STAPLE) ×3
SUT SILK 1 MH (SUTURE) ×2 IMPLANT
SUT SILK 2 0 SH (SUTURE) IMPLANT
SUT VIC AB 1 CTX 36 (SUTURE) ×1
SUT VIC AB 1 CTX36XBRD ANBCTR (SUTURE) IMPLANT
SUT VIC AB 2-0 CTX 36 (SUTURE) IMPLANT
SUT VIC AB 3-0 X1 27 (SUTURE) ×2 IMPLANT
SUT VICRYL 0 TIES 12 18 (SUTURE) ×1 IMPLANT
SUT VICRYL 0 UR6 27IN ABS (SUTURE) ×2 IMPLANT
SYR 20ML LL LF (SYRINGE) ×2 IMPLANT
SYSTEM RETRIEVAL ANCHOR 12 (MISCELLANEOUS) IMPLANT
SYSTEM SAHARA CHEST DRAIN ATS (WOUND CARE) ×1 IMPLANT
TAPE CLOTH 4X10 WHT NS (GAUZE/BANDAGES/DRESSINGS) ×1 IMPLANT
TAPE CLOTH SURG 4X10 WHT LF (GAUZE/BANDAGES/DRESSINGS) IMPLANT
TOWEL GREEN STERILE (TOWEL DISPOSABLE) ×2 IMPLANT
TRAY FOLEY MTR SLVR 16FR STAT (SET/KITS/TRAYS/PACK) ×1 IMPLANT
WATER STERILE IRR 1000ML POUR (IV SOLUTION) ×1 IMPLANT

## 2023-06-07 NOTE — Anesthesia Procedure Notes (Signed)
Arterial Line Insertion Start/End6/28/2024 7:05 AM, 06/07/2023 7:15 AM Performed by: Rosiland Oz, CRNA, CRNA  Preanesthetic checklist: patient identified, IV checked, site marked, risks and benefits discussed, surgical consent, monitors and equipment checked, pre-op evaluation, timeout performed and anesthesia consent Lidocaine 1% used for infiltration Left, radial was placed Catheter size: 20 G Hand hygiene performed  and maximum sterile barriers used   Attempts: 2 Procedure performed without using ultrasound guided technique. Following insertion, dressing applied and Biopatch. Post procedure assessment: normal and unchanged  Patient tolerated the procedure well with no immediate complications.

## 2023-06-07 NOTE — Hospital Course (Addendum)
  PCP is Thana Ates, MD Referring Provider is Si Gaul, MD   History of Present Illness:  Kathleen Roman is sent for consultation regarding a right upper lobe lung nodule.   Kathleen Roman is a 61 year old non-smoker with a past medical history significant for reflux, myalgias, myositis, back pain, and vitamin D deficiency.  Recently she presented to her primary physician with a cough.  A chest x-ray showed a right upper lobe opacity.  A follow-up chest x-ray a few weeks later showed the opacity was persistent.  She then had a CT which showed a 1.8 x 1.6 cm irregular spiculated nodule in the superior aspect of the right upper lobe.  There is no mediastinal or hilar adenopathy.  There was a very small lesion in the liver consistent with a cyst.   She was referred to Dr. Arbutus Ped.  He recommended surgical resection and referred her to me.  She had a PET/CT done showing the lesion to be hypermetabolic.   MRI of the brain showed no evidence of metastatic disease.   Other than the cough she has been feeling well.  She denies any chest pain, pressure, tightness, shortness of breath, change in appetite, weight loss, headaches, visual changes, or unusual bone or joint pain.  She needs both definitive diagnosis and treatment.  Given the high index of suspicion I recommended that we proceed directly to surgical resection.  The plan would be to do a wedge resection and obtain a frozen section.  If non-small cell carcinoma would need a right upper lobectomy, which would be done at the same setting.   I discussed the proposed operative procedure with Kathleen Roman and her husband.  I informed them of the general nature of the procedure including the need for general anesthesia, the incisions to be used, the use of the surgical robot, the use of drains to postoperatively, the expected hospital stay, and the overall recovery.  I informed them of the indications, risks, benefits, and alternatives.  They  understand the risks include, but not limited to death, MI, DVT, PE, bleeding, possible need for transfusion, infection, possible need for conversion to open procedure, prolonged air leak, cardiac arrhythmias, as well as the possibility of other procedural complications.   She understands and accepts the risks and agrees to proceed.  Hospital Course: Kathleen Roman was admitted for elective surgery on 06/07/23 and taken to the OR where robotic-assisted right upper lobe wedge resection was done for frozen section. 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 The bronchial margin was benign. Following the procedure, she was awakened, extubated, and recovered in the PACU before being transferred to [facility]. CXR on POD1 showed subcutaneous emphysema but no pneumothorax. Chest tube was positive for an air leak and was left to water seal. CXR did not show a definite pneumothorax. The air leak resolved by post-op day 3 and the CXR was stable so the pleural tube was removed. Follow up CXR on the same day showed ***

## 2023-06-07 NOTE — Interval H&P Note (Signed)
History and Physical Interval Note:  06/07/2023 7:12 AM  Kathleen Roman  has presented today for surgery, with the diagnosis of RUL NODULE.  The various methods of treatment have been discussed with the patient and family. After consideration of risks, benefits and other options for treatment, the patient has consented to  Procedure(s): XI ROBOTIC ASSISTED THORACOSCOPY-RIGHT UPPER LOBE WEDGE RESECTION, possible lobectomy (Right) as a surgical intervention.  The patient's history has been reviewed, patient examined, no change in status, stable for surgery.  I have reviewed the patient's chart and labs.  Questions were answered to the patient's satisfaction.     Loreli Slot

## 2023-06-07 NOTE — Discharge Instructions (Signed)
Discharge Instructions:  1. You may shower, please wash incisions daily with soap and water and keep dry.  If you wish to cover wounds with dressing you may do so but please keep clean and change daily.  No tub baths or swimming until incisions have completely healed.  If your incisions become red or develop any drainage please call our office at [phone]  2. No Driving until cleared by Dr. Sunday Corn office and you are no longer using narcotic pain medications  3. Monitor your weight daily.. Please use the same scale and weigh at same time... If you gain 5-10 lbs in 48 hours with associated lower extremity swelling, please contact our office at [phone]  4. Fever of 101.5 for at least 24 hours with no source, please contact our office at [phone]  5. Activity- up as tolerated, please walk at least 3 times per day.  Avoid strenuous activity, no lifting, pushing, or pulling with your arms over 8-10 lbs for a minimum of 6 weeks  6. If any questions or concerns arise, please do not hesitate to contact our office at [phone]   Video-Assisted Thoracic Surgery, Care After What can I expect after the procedure? After the procedure, it is common to have: Some pain and soreness in your chest. Pain when you breathe in or cough. Trouble pooping (constipation). Tiredness. Trouble sleeping. Follow these instructions at home: Preventing lung infection  Take deep breaths and cough often. This helps clear mucus and opens your lungs. Doing this helps prevent lung infection (pneumonia). Use an incentive spirometer if told. This tool shows how much you fill your lungs with each breath. Coughing may hurt less if you try to support your chest. Try one of these when you cough: Hold a pillow on your chest. Place both hands flat on your cut or cuts from surgery (incisions). Do not smoke or use any products that contain nicotine or tobacco. If you need help quitting, ask your doctor. Stay  away when people are smoking (avoid secondhand smoke). Medicines Take over-the-counter and prescription medicines only as told by your doctor. If you have pain, take pain medicine before your pain gets very bad. This is important. Doing this will help you breathe and cough more comfortably. If you were prescribed an antibiotic medicine, take it as told by your doctor. Do not stop using the antibiotic even if you start to feel better. If told, take steps to prevent problems with pooping (constipation). You may need to: Drink enough fluid to keep your pee (urine) pale yellow. Take medicines. You will be told what medicines to take. Eat foods that are high in fiber. These include beans, whole grains, and fresh fruits and vegetables. Limit foods that are high in fat and sugar. These include fried or sweet foods. Ask your doctor if you should avoid driving or using machines while you are taking your medicine. Bathing Do not take baths, swim, or use a hot tub. Ask your doctor about taking showers or sponge baths. Caring for your incision from surgery  Follow instructions from your doctor about how to take care of your incision. Make sure you: Wash your hands with soap and water for at least 20 seconds before and after you change your bandage. If you cannot use soap and water, use hand sanitizer. Change your bandage as told. Leave stitches, skin glue, or staples in place for at least 2 weeks. Leave tape strips alone unless you are told to take them off. You may trim  the edges of the tape strips if they curl up. Keep your bandage dry until it is taken off. Check your incision every day for signs of infection. Check for: Redness, swelling, or more pain. Fluid or blood. Warmth. Pus or a bad smell. Activity Avoid activities that use your chest muscles for at least 3-4 weeks. Do not lift anything that is heavier than 10 lb (4.5 kg), or the limit that you are told. Return to your normal activities  when your doctor says that it is safe. Rest as told by your doctor. Get up to take short walks every 1 to 2 hours. Ask for help if you feel weak or unsteady. Do exercises as told by your doctor. General instructions If you were given a sedative during your procedure, do not drive or use machines until your doctor says that it is safe. A sedative is a medicine that helps you relax. If you have a chest tube, care for it as told. Do not travel by airplane during the 2 weeks after your chest tube is taken out, or until your doctor says that this is safe. Keep all follow-up visits. Contact a doctor if: You have any of these signs of infection around an incision: Redness, swelling, or more pain. Fluid or blood. Warmth. Pus or a bad smell. You have a fever or chills. You feel like you may vomit or you vomit. Your pain does not get better with medicine. Get help right away if: You have chest pain. You have fast or uneven heartbeats. You get a rash. You are short of breath. You have trouble breathing. You are mixed up (confused). You have trouble talking. You feel weak, light-headed, or dizzy. You faint. These symptoms may be an emergency. Get help right away. Call your local emergency services (911 in the [place] Do not wait to see if the symptoms will go away. Do not drive yourself to the hospital. Summary Take deep breaths and cough often. This helps clear mucus and opens your lungs. Doing this helps prevent lung infection (pneumonia). If you have pain, take pain medicine before your pain gets very bad. Check your incision from surgery every day for signs of infection. Contact a doctor if you have signs of infection. Return to your normal activities when your doctor says that it is safe. This information is not intended to replace advice given to you by your health care provider. Make sure you discuss any questions you have with your health care provider. Document Revised: 08/19/2020  Document Reviewed: 08/19/2020 Elsevier Patient Education  2024 [employer].

## 2023-06-07 NOTE — Anesthesia Postprocedure Evaluation (Signed)
Anesthesia Post Note  Patient: KENIAH DONOFRIO  Procedure(s) Performed: XI ROBOTIC ASSISTED THORACOSCOPY-RIGHT UPPER LOBE RESECTION (Right: Chest)     Patient location during evaluation: PACU Anesthesia Type: General Level of consciousness: awake and alert Pain management: pain level controlled Vital Signs Assessment: post-procedure vital signs reviewed and stable Respiratory status: spontaneous breathing, nonlabored ventilation, respiratory function stable and patient connected to nasal cannula oxygen Cardiovascular status: blood pressure returned to baseline and stable Postop Assessment: no apparent nausea or vomiting Anesthetic complications: no  No notable events documented.  Last Vitals:  Vitals:   06/07/23 1515 06/07/23 1529  BP: 119/71 114/77  Pulse: 86 87  Resp: 15 14  Temp: 37.1 C 37.1 C  SpO2: 99% 99%    Last Pain:  Vitals:   06/07/23 1529  TempSrc: Oral  PainSc:                  Shelton Silvas

## 2023-06-07 NOTE — Brief Op Note (Addendum)
06/07/2023  10:43 AM  PATIENT:  Kathleen Roman  62 y.o. 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,N0)  PROCEDURES:   XI ROBOTIC ASSISTED THORACOSCOPY- RIGHT UPPER LOBE WEDGE  RESECTION  RIGHT UPPER LOBECTOMY LYMPH NODE DISSECTION INTERCOSTAL NERVE BLOCK   SURGEON:   Loreli Slot, MD - Primary  PHYSICIAN ASSISTANT: Jillyn Hidden, PA  ASSISTANTS: Bruins, Brooklyn L, Scrub Person    ANESTHESIA:   general  EBL:  100 mL   BLOOD ADMINISTERED:none  DRAINS:  86fr Blake x 1    LOCAL MEDICATIONS USED:  Exparel  SPECIMEN:  Right upper lung lobe and multiple lymph nodes  DISPOSITION OF SPECIMEN:  PATHOLOGY  COUNTS:  Correct  DICTATION: done  PLAN OF CARE: Admit to inpatient   PATIENT DISPOSITION:  PACU - hemodynamically stable.   Delay start of Pharmacological VTE agent (>24hrs) due to surgical blood loss or risk of bleeding: no

## 2023-06-07 NOTE — Transfer of Care (Signed)
Immediate Anesthesia Transfer of Care Note  Patient: Kathleen Roman  Procedure(s) Performed: XI ROBOTIC ASSISTED THORACOSCOPY-RIGHT UPPER LOBE RESECTION (Right: Chest)  Patient Location: PACU  Anesthesia Type:General  Level of Consciousness: drowsy and patient cooperative  Airway & Oxygen Therapy: Patient Spontanous Breathing  Post-op Assessment: Report given to RN and Post -op Vital signs reviewed and stable  Post vital signs: Reviewed and stable  Last Vitals:  Vitals Value Taken Time  BP    Temp    Pulse 76 06/07/23 1056  Resp 15 06/07/23 1056  SpO2 98 % 06/07/23 1056  Vitals shown include unvalidated device data.  Last Pain:  Vitals:   06/07/23 0630  TempSrc:   PainSc: 0-No pain         Complications: No notable events documented.

## 2023-06-07 NOTE — Anesthesia Procedure Notes (Addendum)
Procedure Name: Intubation Date/Time: 06/07/2023 7:58 AM  Performed by: Rosiland Oz, CRNAPre-anesthesia Checklist: Patient identified, Emergency Drugs available, Suction available and Patient being monitored Patient Re-evaluated:Patient Re-evaluated prior to induction Oxygen Delivery Method: Circle System Utilized Preoxygenation: Pre-oxygenation with 100% oxygen Induction Type: IV induction Ventilation: Mask ventilation without difficulty Laryngoscope Size: Mac and 3 Grade View: Grade I Tube type: Oral Endobronchial tube: Double lumen EBT, EBT position confirmed by auscultation, EBT position confirmed by fiberoptic bronchoscope and Left and 35 Fr Number of attempts: 1 Airway Equipment and Method: Rigid stylet and Fiberoptic brochoscope Placement Confirmation: ETT inserted through vocal cords under direct vision, positive ETCO2 and breath sounds checked- equal and bilateral Tube secured with: Tape Dental Injury: Teeth and Oropharynx as per pre-operative assessment  Comments: Kathleen Roman, [place]

## 2023-06-07 NOTE — Op Note (Signed)
NAME: Kathleen Roman, Kathleen Roman MEDICAL RECORD NO: [id number] ACCOUNT NO: [id number] DATE OF BIRTH: Jan 26, 1961 FACILITY: [facility] LOCATION: [facility] PHYSICIAN: Salvatore Decent. Dorris Fetch, MD  Operative Report   DATE OF PROCEDURE: 06/07/2023  PREOPERATIVE DIAGNOSIS:  Right upper lobe lung nodule.  POSTOPERATIVE DIAGNOSIS:  Adenocarcinoma right upper lobe, clinical stage IA (T1, N0).  PROCEDURE:   Xi robotic-assisted right thoracoscopy,  Right upper lobe wedge resection,  Right upper lobectomy,  Lymph node dissection,  Intercostal nerve blocks levels 3 through 10.  SURGEON:  Salvatore Decent. Dorris Fetch, MD  ASSISTANT:  Jillyn Hidden, PA   Experienced assistance was necessary for this case due to surgical complexity.  Jillyn Hidden, PA served as the Geophysicist/field seismologist providing assistance with port placement, camera management, robot docking and undocking, instrument exchange, specimen retrieval, suctioning and wound closure.  ANESTHESIA:  General.  FINDINGS:  Frozen section revealed adenocarcinoma.  Bronchial margin negative for tumor.  CLINICAL NOTE:  Kathleen Roman is a 62 year old nonsmoker who presented with a cough and was found to have a right upper lobe opacity that persisted on a repeat chest x-ray. CT showed a 1.8 x 1.6 cm spiculated nodule in the superior aspect of the right upper lobe.  On PET/CT, the nodule was hypermetabolic, but there was no evidence of metastatic disease.  She was offered surgical resection.  The indications, risks, benefits, and alternatives were discussed in detail with the patient.  She understood and accepted the risks and agreed to proceed.  OPERATIVE NOTE:  Kathleen Roman was brought to the preoperative holding area on 06/07/2023.  Anesthesia placed an arterial blood pressure monitoring line and established intravenous access.  She was taken to the operating room, anesthetized, and intubated with a double lumen endotracheal tube.  Intravenous antibiotics were administered.   Sequential compression devices were placed on the calves for DVT prophylaxis.  A Foley catheter was placed.  She was placed in a left lateral decubitus position and the right chest was prepped and draped in the usual sterile fashion.  Single lung ventilation of the left lung was initiated and was tolerated well throughout the procedure.  A timeout was performed.  A solution containing 20 mL of liposomal bupivacaine, 30 mL of 0.5% bupivacaine and 50 mL of saline was prepared.  This solution was used for local at the incision sites as well as for the intercostal nerve blocks.  An incision was made in the eighth interspace in the mid axillary line and 8 mm port was inserted.  The thoracoscope was advanced into the chest.  It was clear that the trocar was within the parenchyma of the left lower lobe, readjustment of the trocar resulted in intrapleural placement.  Carbon dioxide was insufflated per protocol.  A 12 mm robotic port was placed in the eighth interspace anterior to the camera port.  Intercostal nerve blocks then were performed injecting 10 mL of the bupivacaine solution into a subpleural plane at each level from the third to the tenth interspace.  A 12 mm AirSeal port was placed in the tenth interspace posterolaterally and then 2 additional robotic ports were placed in the eighth interspace. The robot was deployed.  The camera arm was docked. Targeting was performed.  The remaining arms were docked.  Robotic instruments were inserted with thoracoscopic visualization.  The right upper lobe nodule at the apex was immediately apparent.  Wedge resection was performed with sequential firings of the robotic stapler.  The specimen was placed into an endoscopic retrieval bag, removed, and sent for frozen  section.  While awaiting the results of the frozen, the trocar site in the lower lobe was stapled using the robotic stapler.  The lymph node dissection then was performed beginning with the inferior pulmonary  ligament. Bipolar cautery was used. Level 9 node was removed.  The lung then was retracted anteriorly and level 7 and 8 nodes were removed. The space between the right upper lobe bronchus and bronchus intermedius was explored and a level 11 node was removed from that area.  The upper lobe then was retracted inferiorly.  The pleural reflection was divided between the hilum and the azygos. Level 10 node was removed. By this point, the frozen section returned showing adenocarcinoma.  The right paratracheal space was explored by opening the mediastinal pleura superior to the azygos vein and level 4R nodes were removed.  Minimal cautery was used in the paratracheal space and Surgicel was packed into that space.  The minor fissure was incomplete.  The fissure was relatively complete at the confluence of the major and minor fissures.  The pleura overlying the pulmonary artery was incised and a plane was developed superficial to the pulmonary artery. Level 12 nodes were removed.  The fissure was completed posteriorly with sequential firings of the robotic stapler.  Anteriorly, there was a large anterior apical trunk and a very small posterior ascending branch that was really not visible until after the superior pulmonary vein had been divided.  The superior pulmonary vein was identified, the middle lobe vein was preserved and the upper lobe branches were encircled and divided with the robotic stapler.  The upper lobe PA branches were encircled after removing nodes from that area and then divided with a single firing of the robotic stapler.  The minor fissure was completed with sequential firings of the robotic stapler and then the stapler was placed across the right upper lobe bronchus at its origin and closed.  A test inflation showed good aeration of the lower and middle lobes.  The stapler was fired, transecting the right upper lobe bronchus.  The middle lobe was tacked to the lower lobe using the robotic stapler to  prevent torsion.  The chest was copiously irrigated with saline.  All the port sites were inspected for bleeding.  A test inflation to 30 cm water revealed no leakage from the bronchial stump or the parenchyma.  The sponges and vessel loop were removed.  The right upper lobe was placed into an endoscopic retrieval bag, which was brought down to the inferior aspect of the chest.  The robotic instruments were removed.  The robot was undocked.  The anterior eighth interspace incision was lengthened to 3 cm and the specimen was removed through that incision and sent for frozen section.  The frozen on the bronchial margin subsequently returned with no tumor seen.  Final inspection was made of the port sites for hemostasis.  There was no ongoing bleeding. A 28-French Blake drain was placed through the original port incision and directed to the apex.  It was secured with #1 silk suture.  Dual lung ventilation was resumed.  The incisions were closed in standard fashion.  Dermabond was applied.  The chest tube was placed to a Pleur-Evac on waterseal.  All sponge, needle and instrument counts were correct at the end of the procedure.  The patient was extubated in the operating room and taken to the postanesthetic care unit in good condition.  Experienced assistance was necessary for this case due to surgical complexity.  Joette Catching  Roddenberry, PA served as Museum/gallery curator providing assistance with port placement, camera management, robot docking and undocking, instrument exchange, specimen retrieval, suctioning and wound closure.   VAI D: 06/07/2023 4:57:05 pm T: 06/07/2023 10:40:00 pm  JOB: [id number]/ [id number]

## 2023-06-07 NOTE — Discharge Summary (Addendum)
Physician Discharge Summary  Patient ID: ARLETTE DELIRA MRN: [id number] DOB/AGE: Jan 13, 1961 62 y.o.  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 Zumbrunnen is sent for consultation regarding a right upper lobe lung nodule.   Stela Dunsford is a 62 year old non-smoker with a past medical history significant for reflux, myalgias, myositis, back pain, and vitamin D deficiency.  Recently she presented to her primary physician with a cough.  A chest x-ray showed a right upper lobe opacity.  A follow-up chest x-ray a few weeks later showed the opacity was persistent.  She then had a CT which showed a 1.8 x 1.6 cm irregular spiculated nodule in the superior aspect of the right upper lobe.  There is no mediastinal or hilar adenopathy.  There was a very small lesion in the liver consistent with a cyst.   She was referred to Dr. Arbutus Ped.  He recommended surgical resection and referred her to me.  She had a PET/CT done showing the lesion to be hypermetabolic.   MRI of the brain showed no evidence of metastatic disease.   Other than the cough she has been feeling well.  She denies any chest pain, pressure, tightness, shortness of breath, change in appetite, weight loss, headaches, visual changes, or unusual bone or joint pain.  She needs both definitive diagnosis and treatment.  Given the high index of suspicion I recommended that we proceed directly to surgical resection.  The plan would be to do a wedge resection and obtain a frozen section.  If non-small cell carcinoma would need a right upper lobectomy, which would be done at the same setting.   I discussed the proposed operative  procedure with Mrs. Lins and her husband.  I informed them of the general nature of the procedure including the need for general anesthesia, the incisions to be used, the use of the surgical robot, the use of drains to postoperatively, the expected hospital stay, and the overall recovery.  I informed them of the indications, risks, benefits, and alternatives.  They understand the risks include, but not limited to death, MI, DVT, PE, bleeding, possible need for transfusion, infection, possible need for conversion to open procedure, prolonged air leak, cardiac arrhythmias, as well as the possibility of other procedural complications.   She understands and accepts the risks and agrees to proceed.  Hospital Course: Ms. Parham was admitted for elective surgery on 06/07/23 and taken to the OR where robotic-assisted right upper lobe wedge resection was done for frozen section. 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 The bronchial margin was benign. Following the procedure, she was awakened, extubated, and recovered in the PACU before being transferred to [facility]. CXR on POD1 showed subcutaneous emphysema but no pneumothorax. Chest tube was positive for an air leak and was left to water seal. CXR did not show a definite pneumothorax. The air leak resolved by post-op day 3 and the CXR was stable so the pleural tube was removed. Follow up CXR on the same day showed no significant pneumothorax.  By the time of discharge, Ms Devanney was maintaining adequate O2 saturation  on RA and ambulating independently. The incisions were dry and intact.   Consults: None  Significant Diagnostic Studies:  ADDENDUM REPORT: 04/22/2023 22:51   ADDENDUM: Recommend PET-CT and/or tissue sampling of the 1.8 cm right upper lobe nodule.     Electronically Signed   By: Beckie Salts M.D.   On: 04/22/2023 22:51     Addended by Gordan Payment, MD on 04/22/2023 10:53 PM    Study Result  Narrative & Impression  CLINICAL DATA:  Persistent right apical nodular density on recent chest radiographs.   EXAM: CT CHEST WITH CONTRAST   TECHNIQUE: Multidetector CT imaging of the chest was performed during intravenous contrast administration.   RADIATION DOSE REDUCTION: This exam was performed according to the departmental dose-optimization program which includes automated exposure control, adjustment of the mA and/or kV according to patient size and/or use of iterative reconstruction technique.   CONTRAST:  75mL ISOVUE-300 IOPAMIDOL (ISOVUE-300) INJECTION 61%   COMPARISON:  Chest radiographs dated 04/04/2023 and 03/04/2023.   FINDINGS: Cardiovascular: Minimal atheromatous aortic and coronary artery calcifications. Normal sized heart. There is some lower density blood in the left upper lobe pulmonary artery simulating a pulmonary embolism in the axial plane. This does not appear to represent a pulmonary embolism in the sagittal coronal plane.   Mediastinum/Nodes: Multiple subcentimeter nodules in a normal sized thyroid gland. Not clinically significant; no follow-up imaging recommended (ref: J Am Coll Radiol. 2015 Feb;12(2): 143-50).   No enlarged lymph nodes. Normal sized heart.   Lungs/Pleura: There is an irregular spiculated nodule in the superior aspect of the right upper lobe, corresponding to the persistent apical nodular density seen radiographically. This measures 1.8 x 1.6 cm on image number 22/3. This measures 1.7 cm in length in the coronal plane with adjacent pleural tethering.   There are 4 subcentimeter ground glass nodular opacities in the left upper lobe, 2 on image number 29/3 and 2 on image number 22/3. A 3 mm similar nodular density is demonstrated in the left upper lobe on image number 35/3.   There are similar, more vague areas of minimal ground-glass nodularity in the  right upper lobe posteriorly as well as additional 1 mm and 3 mm more discrete nodules in the right upper lobe on images 41-45/3.   Mild biapical pleural and parenchymal scarring. No pleural fluid.   Upper Abdomen: Diffuse low density of the liver. 8 mm rounded mass in the central right lobe of the liver on image number 120/2. This has a mean density measurement of 17 Hounsfield units. Normal-appearing adrenal glands.   Musculoskeletal: Thoracic spine degenerative changes. No evidence of bony metastatic disease.   IMPRESSION: 1. 1.8 x 1.6 x 1.6 cm irregular spiculated nodule in the superior aspect of the right upper lobe, corresponding to the persistent apical nodular density seen radiographically. This is highly suspicious for a primary lung carcinoma. 2. Multiple small, nonspecific ground-glass nodular opacities in the left upper lobe and more vague areas of minimal ground-glass nodularity in the right upper lobe. These could be infectious or inflammatory in nature. Metastatic disease is less likely. 3. Diffuse hepatic steatosis. 4. 8 mm rounded mass in the central right lobe of the liver. This is too small to characterize and could represent a cyst, metastasis or hemangioma. A cyst is favored. 5. Minimal calcific coronary artery and aortic atherosclerosis.   These results will be called to the ordering clinician or representative by the Radiologist Assistant, and communication documented in the PACS or [employer].  Aortic Atherosclerosis (ICD10-I70.0).   Electronically Signed: By: Beckie Salts M.D. On: 04/22/2023 22:35     Treatments: Surgery Operative Report    DATE OF PROCEDURE: 06/07/2023   PREOPERATIVE DIAGNOSIS:  Right upper lobe lung nodule.   POSTOPERATIVE DIAGNOSIS:  Adenocarcinoma right upper lobe, clinical stage IA (T1, N0).   PROCEDURE:  Xi robotic-assisted right thoracoscopy, right upper lobe wedge resection, right upper lobectomy, lymph node  dissection, intercostal nerve blocks levels 3 through 10.   SURGEON:  Salvatore Decent. Dorris Fetch, MD   ASSISTANT:  Jillyn Hidden, PA   ANESTHESIA:  General.   FINDINGS:  Frozen section revealed adenocarcinoma.  Bronchial margin negative for tumor.   PATH:  SURGICAL PATHOLOGY Surgical pathology Collected: 06/07/23 0844  Lab status: Final-Edited  Resulting lab: [facility]  Value: SURGICAL PATHOLOGY CASE: [phone] PATIENT: Tamala Bari Surgical Pathology Report     Clinical History: RUL nodule (cm)     FINAL MICROSCOPIC DIAGNOSIS:  A. LUNG, RIGHT UPPER LOBE, WEDGE RESECTION: Adenocarcinoma, 2.1 cm Carcinoma focally involves visceral pleural connective tissue (PL1) Negative for lymphovascular involvement See oncology table  B. LUNG, RIGHT UPPER LOBE, LOBECTOMY: Findings consistent with previous wedge excision Negative for residual carcinoma All surgical margins negative for carcinoma One lymph node negative for metastatic carcinoma (0/1)  C. LYMPH NODE, LEVEL 9, EXCISION: One lymph node negative for metastatic carcinoma (0/1)  D. LYMPH NODE, LEVEL 10, EXCISION: One lymph node negative for metastatic carcinoma (0/1)  E. LYMPH NODE, LEVEL 8, EXCISION: One lymph node negative for metastatic carcinoma (0/1)  F. LYMPH NODE, LEVEL 7, EXCISION: One lymph node negative for metastatic carcinoma (0/1)  G. LYMPH NODE, LEVEL 7 #2, EXCISION: One lymph node negative for metastatic carcinoma (0/1)  H. LYMPH NODE, LEVEL 10 #2, EXCISION: One lymph node negative for metastatic carcinoma (0/1)  I. LYMPH NODE, LEVEL 11, EXCISION: One lymph node negative for metastatic carcinoma (0/1)  J. LYMPH NODE, LEVEL 10 #3, EXCISION: One lymph node negative for metastatic carcinoma (0/1)  K. LYMPH NODE, LEVEL 4R, EXCISION: One lymph node negative for metastatic carcinoma (0/1)  L. LYMPH NODE, LEVEL 4R #2, EXCISION: One lymph node negative for metastatic  carcinoma (0/1)  M. LYMPH NODE, LEVEL 12, EXCISION: One lymph node negative for metastatic carcinoma (0/1)  N. LYMPH NODE, LEVEL 11 #2, EXCISION: One lymph node negative for metastatic carcinoma (0/1)  O. LYMPH NODE, LEVEL 12 #2, EXCISION: One lymph node negative for metastatic carcinoma (0/1)  P. LYMPH NODE, LEVEL 12 #3, EXCISION: One lymph node negative for metastatic carcinoma (0/1)  ONCOLOGY TABLE: LUNG: Resection Synchronous Tumors: Not applicable Total Number of Primary Tumors: 1 Procedure: Right upper lobe wedge, right upper lobectomy and lymph node biopsies Specimen Laterality: Right upper lobe Tumor Focality: Unifocal Tumor Site: Right upper lobe Tumor Size: 2.1 x 1.7 x 1.2 cm Visceral Pleura Invasion: Into visceral pleural connective tissue (PL1) Direct Invasion of Adjacent Structures: Not identified Lymphovascular Invasion: Not identified Margins: All margins negative for invasive carcinoma      Closest Margin(s) to Invasive Carcinoma: Greater than 2 cm Treatment Effect: No known presurgical therapy Regional Lymph Nodes:      Number of Lymph Nodes Involved: 0                           Nodal Sites with Tumor: 0      Number of Lymph Nodes Examined: 15  Nodal Sites Examined: 4, 7, 8, 9, 10, 11, 12 Distant Metastasis:      Distant Site(s) Involved: Not applicable Pathologic Stage Classification (pTNM, AJCC 8th Edition): pT2a, pN0 Ancillary Studies: Can be performed if requested Representative Tumor Block: A2-A3 Comment(s): The carcinoma extends into the connective tissue of the visceral pleura (PL1). (v4.2.0.1)   Discharge Exam: Blood pressure (!) 92/53, pulse 86, temperature 98.4 F (36.9 C), temperature source Oral, resp. rate 18, height 5\' 1"  (1.549 m), weight 59 kg, SpO2 94 %.  General appearance: alert, cooperative, and no distress Neurologic: intact Heart: regular rate and rhythm Lungs: clear to auscultation bilaterally No air leak  with repeated coughing  Disposition:  Discharged to home in stable condition  Allergies as of 06/10/2023       Reactions   Amoxicillin-pot Clavulanate Nausea And Vomiting   Darvocet [propoxyphene N-acetaminophen]    hallucinations   Hydrocodone-acetaminophen Nausea And Vomiting   Naproxen Nausea And Vomiting   Oxycodone Nausea And Vomiting   Peanuts [nuts] Other (See Comments)   All nuts. Shortness of breath        Medication List     TAKE these medications    acetaminophen 500 MG tablet Commonly known as: TYLENOL Take 2 tablets (1,000 mg total) by mouth every 6 (six) hours.   calcium carbonate 500 MG chewable tablet Commonly known as: TUMS - dosed in mg elemental calcium Chew 1 tablet by mouth daily as needed for indigestion or heartburn.   EPINEPHrine 0.3 mg/0.3 mL Soaj injection Commonly known as: EPI-PEN Inject 0.3 mg into the muscle daily as needed (allergic reaction).   gabapentin 300 MG capsule Commonly known as: NEURONTIN Take 1 capsule (300 mg total) by mouth 2 (two) times daily.   traMADol 50 MG tablet Commonly known as: ULTRAM Take 1 tablet (50 mg total) by mouth every 6 (six) hours as needed (mild pain).   Vitamin D3 25 MCG (1000 UT) Caps Take 1 capsule by mouth daily.        Follow-up Information     Loreli Slot, MD. Go on 06/25/2023.   Specialty: Cardiothoracic Surgery Why: Your appointment is at 1pm Please obtain an chest x-ray 1 hour before the appointment at [facility] located at [address] Contact information: [address] [employer] [address] [place] [address] [phone]         [facility]. Go on 06/25/2023.   Why: Please obtain a chest x-ray at 12pm, 1 hour before the appointment with Dr. Dorris Fetch. Contact information: [address] [place] [address]                   Signed: Leary Roca 06/10/2023, 1:48 PM

## 2023-06-07 NOTE — Anesthesia Procedure Notes (Signed)
Central Venous Catheter Insertion Performed by: Shelton Silvas, MD, anesthesiologist Start/End6/28/2024 7:15 AM, 06/07/2023 7:25 AM Patient location: Pre-op. Preanesthetic checklist: patient identified, IV checked, site marked, risks and benefits discussed, surgical consent, monitors and equipment checked, pre-op evaluation, timeout performed and anesthesia consent Position: Trendelenburg Lidocaine 1% used for infiltration and patient sedated Hand hygiene performed , maximum sterile barriers used  and Seldinger technique used Catheter size: 8 Fr Total catheter length 16. Central line was placed.Double lumen Procedure performed using ultrasound guided technique. Ultrasound Notes:anatomy identified, needle tip was noted to be adjacent to the nerve/plexus identified, no ultrasound evidence of intravascular and/or intraneural injection and image(s) printed for medical record Attempts: 1 Following insertion, dressing applied, line sutured and Biopatch. Post procedure assessment: blood return through all ports  Patient tolerated the procedure well with no immediate complications.

## 2023-06-07 NOTE — Anesthesia Preprocedure Evaluation (Addendum)
Anesthesia Evaluation  Patient identified by MRN, date of birth, ID band Patient awake    Reviewed: Allergy & Precautions, NPO status , Patient's Chart, lab work & pertinent test results  Airway Mallampati: I  TM Distance: >3 FB Neck ROM: Full    Dental  (+) Teeth Intact, Dental Advisory Given   Pulmonary    breath sounds clear to auscultation       Cardiovascular negative cardio ROS  Rhythm:Regular Rate:Normal     Neuro/Psych  Headaches  negative psych ROS   GI/Hepatic Neg liver ROS,GERD  ,,  Endo/Other  negative endocrine ROS    Renal/GU negative Renal ROS     Musculoskeletal negative musculoskeletal ROS (+)    Abdominal   Peds  Hematology negative hematology ROS (+)   Anesthesia Other Findings   Reproductive/Obstetrics                             Anesthesia Physical Anesthesia Plan  ASA: 2  Anesthesia Plan: General   Post-op Pain Management: Ofirmev IV (intra-op)* and Toradol IV (intra-op)*   Induction: Intravenous  PONV Risk Score and Plan: 4 or greater and Ondansetron, Dexamethasone, Midazolam and Scopolamine patch - Pre-op  Airway Management Planned: Double Lumen EBT  Additional Equipment: Arterial line, CVP and Ultrasound Guidance Line Placement  Intra-op Plan:   Post-operative Plan: Extubation in OR  Informed Consent: I have reviewed the patients History and Physical, chart, labs and discussed the procedure including the risks, benefits and alternatives for the proposed anesthesia with the patient or authorized representative who has indicated his/her understanding and acceptance.     Dental advisory given  Plan Discussed with: CRNA  Anesthesia Plan Comments:        Anesthesia Quick Evaluation

## 2023-06-08 ENCOUNTER — Inpatient Hospital Stay (HOSPITAL_COMMUNITY): Payer: 59

## 2023-06-08 LAB — BASIC METABOLIC PANEL
Anion gap: 6 (ref 5–15)
BUN: 7 mg/dL — ABNORMAL LOW (ref 8–23)
CO2: 24 mmol/L (ref 22–32)
Calcium: 8.2 mg/dL — ABNORMAL LOW (ref 8.9–10.3)
Chloride: 102 mmol/L (ref 98–111)
Creatinine, Ser: 0.86 mg/dL (ref 0.44–1.00)
GFR, Estimated: >60 mL/min (ref >60)
Glucose, Bld: 109 mg/dL — ABNORMAL HIGH (ref 70–99)
Potassium: 3.3 mmol/L — ABNORMAL LOW (ref 3.5–5.1)
Sodium: 132 mmol/L — ABNORMAL LOW (ref 135–145)

## 2023-06-08 LAB — CBC
HCT: 31.1 % — ABNORMAL LOW (ref 36.0–46.0)
Hemoglobin: 10.1 g/dL — ABNORMAL LOW (ref 12.0–15.0)
MCH: 30.4 pg (ref 26.0–34.0)
MCHC: 32.5 g/dL (ref 30.0–36.0)
MCV: 93.7 fL (ref 80.0–100.0)
Platelets: 142 10*3/uL — ABNORMAL LOW (ref 150–400)
RBC: 3.32 MIL/uL — ABNORMAL LOW (ref 3.87–5.11)
RDW: 12.2 % (ref 11.5–15.5)
WBC: 8.9 10*3/uL (ref 4.0–10.5)
nRBC: 0 % (ref 0.0–0.2)

## 2023-06-08 MED ORDER — POTASSIUM CHLORIDE 10 MEQ/50ML IV SOLN
10.0000 meq | INTRAVENOUS | Status: AC
  Administered 2023-06-08 (×4): 10 meq via INTRAVENOUS
  Filled 2023-06-08 (×3): qty 50

## 2023-06-08 MED ORDER — POTASSIUM CHLORIDE 10 MEQ/50ML IV SOLN
10.0000 meq | Freq: Once | INTRAVENOUS | Status: AC
  Administered 2023-06-08: 10 meq via INTRAVENOUS
  Filled 2023-06-08: qty 50

## 2023-06-08 NOTE — Progress Notes (Signed)
1 Day Post-Op Procedure(s) (LRB): XI ROBOTIC ASSISTED THORACOSCOPY-RIGHT UPPER LOBE RESECTION (Right) Subjective: C/o pain right shoulder, no respiratory symptoms  Objective: Vital signs in last 24 hours: Temp:  [97.4 F (36.3 C)-98.7 F (37.1 C)] 98.7 F (37.1 C) (06/29 0830) Pulse Rate:  [73-87] 79 (06/29 0830) Cardiac Rhythm: Normal sinus rhythm (06/29 0704) Resp:  [10-21] 21 (06/29 0830) BP: (87-119)/(54-78) 112/54 (06/29 0800) SpO2:  [92 %-100 %] 97 % (06/29 0830) Arterial Line BP: (113-134)/(56-70) 113/60 (06/28 1500)  Hemodynamic parameters for last 24 hours:    Intake/Output from previous day: 06/28 0701 - 06/29 0700 In: 2980 [P.O.:480; I.V.:2000; IV Piggyback:500] Out: 1390 [Urine:1150; Blood:100; Chest Tube:140] Intake/Output this shift: Total I/O In: 237 [P.O.:237] Out: 60 [Chest Tube:60]  General appearance: alert, cooperative, and no distress Neurologic: intact Heart: regular rate and rhythm Lungs: clear to auscultation bilaterally Abdomen: normal findings: soft, non-tender + air leak with cough  Lab Results: Recent Labs    06/05/23 1059 06/08/23 0520  WBC 5.8 8.9  HGB 14.0 10.1*  HCT 41.7 31.1*  PLT 189 142*   BMET:  Recent Labs    06/05/23 1059 06/08/23 0520  NA 134* 132*  K 3.7 3.3*  CL 106 102  CO2 19* 24  GLUCOSE 116* 109*  BUN 7* 7*  CREATININE 0.65 0.86  CALCIUM 9.2 8.2*    PT/INR:  Recent Labs    06/05/23 1059  LABPROT 12.6  INR 0.9   ABG    Component Value Date/Time   PHART 7.4 06/05/2023 1051   HCO3 23.5 06/05/2023 1051   ACIDBASEDEF 1.0 06/05/2023 1051   O2SAT 98.8 06/05/2023 1051   CBG (last 3)  No results for input(s): "GLUCAP" in the last 72 hours.  Assessment/Plan: S/P Procedure(s) (LRB): XI ROBOTIC ASSISTED THORACOSCOPY-RIGHT UPPER LOBE RESECTION (Right) POD # 1- looks great Has SQ emphysema on CXR and exam, lung up and CT functioning well + air leak but less than yesterday Keep CT to water seal Right  shoulder pain likely secondary to tube at apex Hypokalemia- supplement SCD + enoxaparin for DVT prophylaxis Ambulate Tolerating diet Pain well controlled except shoulder but only using tylenol    LOS: 1 day    Loreli Slot 06/08/2023

## 2023-06-09 ENCOUNTER — Inpatient Hospital Stay (HOSPITAL_COMMUNITY): Payer: 59

## 2023-06-09 LAB — COMPREHENSIVE METABOLIC PANEL
ALT: 23 U/L (ref 0–44)
AST: 22 U/L (ref 15–41)
Albumin: 2.9 g/dL — ABNORMAL LOW (ref 3.5–5.0)
Alkaline Phosphatase: 44 U/L (ref 38–126)
Anion gap: 5 (ref 5–15)
BUN: 7 mg/dL — ABNORMAL LOW (ref 8–23)
CO2: 26 mmol/L (ref 22–32)
Calcium: 8.3 mg/dL — ABNORMAL LOW (ref 8.9–10.3)
Chloride: 105 mmol/L (ref 98–111)
Creatinine, Ser: 0.81 mg/dL (ref 0.44–1.00)
GFR, Estimated: >60 mL/min (ref >60)
Glucose, Bld: 103 mg/dL — ABNORMAL HIGH (ref 70–99)
Potassium: 3.9 mmol/L (ref 3.5–5.1)
Sodium: 136 mmol/L (ref 135–145)
Total Bilirubin: 0.7 mg/dL (ref 0.3–1.2)
Total Protein: 5.3 g/dL — ABNORMAL LOW (ref 6.5–8.1)

## 2023-06-09 LAB — CBC
HCT: 31.3 % — ABNORMAL LOW (ref 36.0–46.0)
Hemoglobin: 10.2 g/dL — ABNORMAL LOW (ref 12.0–15.0)
MCH: 30.9 pg (ref 26.0–34.0)
MCHC: 32.6 g/dL (ref 30.0–36.0)
MCV: 94.8 fL (ref 80.0–100.0)
Platelets: 149 10*3/uL — ABNORMAL LOW (ref 150–400)
RBC: 3.3 MIL/uL — ABNORMAL LOW (ref 3.87–5.11)
RDW: 12.4 % (ref 11.5–15.5)
WBC: 8.1 10*3/uL (ref 4.0–10.5)
nRBC: 0 % (ref 0.0–0.2)

## 2023-06-09 MED ORDER — KETOROLAC TROMETHAMINE 15 MG/ML IJ SOLN: 15.0000 mg | Freq: Four times a day (QID) | INTRAMUSCULAR | Status: DC | PRN

## 2023-06-09 NOTE — Progress Notes (Signed)
2 Days Post-Op Procedure(s) (LRB): XI ROBOTIC ASSISTED THORACOSCOPY-RIGHT UPPER LOBE RESECTION (Right) Subjective: Feels a little short of breath this AM Light headed after taking tramadol last night  Objective: Vital signs in last 24 hours: Temp:  [97.6 F (36.4 C)-98.6 F (37 C)] 98.6 F (37 C) (06/30 0521) Pulse Rate:  [75-94] 91 (06/30 0521) Cardiac Rhythm: Normal sinus rhythm (06/30 0704) Resp:  [17-21] 18 (06/30 0521) BP: (101-121)/(52-69) 101/52 (06/30 0521) SpO2:  [92 %-97 %] 92 % (06/30 0521)  Hemodynamic parameters for last 24 hours:    Intake/Output from previous day: 06/29 0701 - 06/30 0700 In: 917 [P.O.:917] Out: 240 [Chest Tube:240] Intake/Output this shift: No intake/output data recorded.  General appearance: alert, cooperative, and no distress Neurologic: intact Heart: regular rate and rhythm Lungs: diminished breath sounds slightly on right and SQ emphysema unchanged + small air leak after tube cleared  Lab Results: Recent Labs    06/08/23 0520 06/09/23 0525  WBC 8.9 8.1  HGB 10.1* 10.2*  HCT 31.1* 31.3*  PLT 142* 149*   BMET:  Recent Labs    06/08/23 0520 06/09/23 0525  NA 132* 136  K 3.3* 3.9  CL 102 105  CO2 24 26  GLUCOSE 109* 103*  BUN 7* 7*  CREATININE 0.86 0.81  CALCIUM 8.2* 8.3*    PT/INR: No results for input(s): "LABPROT", "INR" in the last 72 hours. ABG    Component Value Date/Time   PHART 7.4 06/05/2023 1051   HCO3 23.5 06/05/2023 1051   ACIDBASEDEF 1.0 06/05/2023 1051   O2SAT 98.8 06/05/2023 1051   CBG (last 3)  No results for input(s): "GLUCAP" in the last 72 hours.  Assessment/Plan: S/P Procedure(s) (LRB): XI ROBOTIC ASSISTED THORACOSCOPY-RIGHT UPPER LOBE RESECTION (Right) POD # 2 Doing well No air leak on initial cough but question of a pneumo on CXR this AM After fibrinous exudate cleared from tube there was a small air leak Keep tube on water seal today Continue acetaminophen and gabapentin Tramadol  PRN Will add Toradol PRN as well- adverse reaction to naproxen is N/V Ambulate SCD + enoxaparin   LOS: 2 days    Loreli Slot 06/09/2023

## 2023-06-09 NOTE — Progress Notes (Signed)
Mobility Specialist: Progress Note   06/09/23 1416  Mobility  Activity Ambulated with assistance in hallway  Level of Assistance Minimal assist, patient does 75% or more  Assistive Device Four wheel walker  Distance Ambulated (ft) 800 ft  Activity Response Tolerated well  Mobility Referral Yes  $Mobility charge 1 Mobility  Mobility Specialist Start Time (ACUTE ONLY) 1343  Mobility Specialist Stop Time (ACUTE ONLY) 1414  Mobility Specialist Time Calculation (min) (ACUTE ONLY) 31 min   Post-Mobility: 93 HR, 94% SpO2  Received pt in bed having no complaints and agreeable to mobility. MinA with bed mobility and contact guard during ambulation. Pt was asymptomatic throughout ambulation and returned to room w/o fault. Left in bed w/ call bell in reach and all needs met.  Selin Eisler Mobility Specialist Please contact via SecureChat or Rehab office at [phone]

## 2023-06-10 ENCOUNTER — Telehealth: Payer: Self-pay

## 2023-06-10 ENCOUNTER — Inpatient Hospital Stay (HOSPITAL_COMMUNITY): Payer: 59

## 2023-06-10 LAB — SURGICAL PATHOLOGY

## 2023-06-10 MED ORDER — ACETAMINOPHEN 500 MG PO TABS: 1000.0000 mg | ORAL_TABLET | Freq: Four times a day (QID) | ORAL | 0 refills | Status: AC

## 2023-06-10 MED ORDER — TRAMADOL HCL 50 MG PO TABS: 50.0000 mg | ORAL_TABLET | Freq: Four times a day (QID) | ORAL | 0 refills | Status: DC | PRN

## 2023-06-10 MED ORDER — GABAPENTIN 300 MG PO CAPS: 300.0000 mg | ORAL_CAPSULE | Freq: Two times a day (BID) | ORAL | 1 refills | Status: DC

## 2023-06-10 NOTE — Progress Notes (Signed)
Mobility Specialist Progress Note:   06/10/23 1500  Mobility  Activity Ambulated with assistance in hallway  Level of Assistance Modified independent, requires aide device or extra time  Assistive Device Four wheel walker  Distance Ambulated (ft) 600 ft  Activity Response Tolerated well  Mobility Referral Yes  $Mobility charge 1 Mobility  Mobility Specialist Start Time (ACUTE ONLY) 1430  Mobility Specialist Stop Time (ACUTE ONLY) 1445  Mobility Specialist Time Calculation (min) (ACUTE ONLY) 15 min    Pre Mobility: 104 HR During Mobility: 108 HR Post Mobility:  97 HR,   90% SpO2  Pt received in bed, agreeable to mobility. MinA for bed mobility. Ambulated in hallway w/ RW, modified independent. Displayed audible SOB, but denied any dizziness or fatigue. No c/o throughout. Pt left on EOB with call bell and family present.  D'Vante Earlene Plater Mobility Specialist Please contact via Special educational needs teacher or Rehab office at [phone]

## 2023-06-10 NOTE — Progress Notes (Signed)
3 Days Post-Op Procedure(s) (LRB): XI ROBOTIC ASSISTED THORACOSCOPY-RIGHT UPPER LOBE RESECTION (Right) Subjective: Some incisional pain  Objective: Vital signs in last 24 hours: Temp:  [97.6 F (36.4 C)-98.4 F (36.9 C)] 98.4 F (36.9 C) (07/01 0342) Pulse Rate:  [83-86] 86 (07/01 0342) Cardiac Rhythm: Normal sinus rhythm (07/01 0708) Resp:  [17-21] 18 (07/01 0342) BP: (92-97)/(45-70) 92/53 (07/01 0342) SpO2:  [94 %] 94 % (07/01 0342)  Hemodynamic parameters for last 24 hours:    Intake/Output from previous day: 06/30 0701 - 07/01 0700 In: 1008 [P.O.:1008] Out: 220 [Chest Tube:220] Intake/Output this shift: No intake/output data recorded.  General appearance: alert, cooperative, and no distress Neurologic: intact Heart: regular rate and rhythm Lungs: clear to auscultation bilaterally No air leak with repeated coughing  Lab Results: Recent Labs    06/08/23 0520 06/09/23 0525  WBC 8.9 8.1  HGB 10.1* 10.2*  HCT 31.1* 31.3*  PLT 142* 149*   BMET:  Recent Labs    06/08/23 0520 06/09/23 0525  NA 132* 136  K 3.3* 3.9  CL 102 105  CO2 24 26  GLUCOSE 109* 103*  BUN 7* 7*  CREATININE 0.86 0.81  CALCIUM 8.2* 8.3*    PT/INR: No results for input(s): "LABPROT", "INR" in the last 72 hours. ABG    Component Value Date/Time   PHART 7.4 06/05/2023 1051   HCO3 23.5 06/05/2023 1051   ACIDBASEDEF 1.0 06/05/2023 1051   O2SAT 98.8 06/05/2023 1051   CBG (last 3)  No results for input(s): "GLUCAP" in the last 72 hours.  Assessment/Plan: S/P Procedure(s) (LRB): XI ROBOTIC ASSISTED THORACOSCOPY-RIGHT UPPER LOBE RESECTION (Right) POD # 3 No air leak.  Cxr unchanged. Will dc chest tube Repeat CXR after tube removed If no clinical issue and CXR stable after CT removal, possible dc home later today   LOS: 3 days    Loreli Slot 06/10/2023

## 2023-06-10 NOTE — Telephone Encounter (Signed)
STD form completed and faxed to [facility] @ [phone]. Beginning LOA 06/07/23 through 08/05/23. DOS 06/07/23.

## 2023-06-10 NOTE — TOC CM/SW Note (Signed)
Transition of Care [facility]) - Inpatient Brief Assessment   Patient Details  Name: Kathleen Roman MRN: [id number] Date of Birth: 1961/07/29  Transition of [facility]) CM/SW Contact:    Harriet Masson, RN Phone Number: 06/10/2023, 12:03 PM   Clinical Narrative:  3 Days Post-Op Procedure(s) (LRB): XI ROBOTIC ASSISTED THORACOSCOPY-RIGHT UPPER LOBE RESECTION (Right) TOC following.   Transition of Care Asessment: Insurance and Status: Insurance coverage has been reviewed Patient has primary care physician: Yes Home environment has been reviewed: safe to discharge home when medically stable Prior level of function:: fine at home Prior/Current Home Services: No current home services Social Determinants of Health Reivew: SDOH reviewed no interventions necessary Readmission risk has been reviewed: Yes Transition of care needs: no transition of care needs at this time

## 2023-06-20 ENCOUNTER — Other Ambulatory Visit: Payer: Self-pay | Admitting: Thoracic Surgery (Cardiothoracic Vascular Surgery)

## 2023-06-20 ENCOUNTER — Other Ambulatory Visit: Payer: Self-pay

## 2023-06-20 DIAGNOSIS — C3491 Malignant neoplasm of unspecified part of right bronchus or lung: Secondary | ICD-10-CM

## 2023-06-21 NOTE — Progress Notes (Signed)
The proposed treatment discussed in conference is for discussion purpose only and is not a binding recommendation.  The patients have not been physically examined, or presented with their treatment options.  Therefore, final treatment plans cannot be decided.  

## 2023-06-25 ENCOUNTER — Encounter: Payer: Self-pay | Admitting: Thoracic Surgery (Cardiothoracic Vascular Surgery)

## 2023-06-25 ENCOUNTER — Ambulatory Visit: Admission: RE | Admit: 2023-06-25 | Payer: 59 | Source: Ambulatory Visit

## 2023-06-25 ENCOUNTER — Ambulatory Visit (INDEPENDENT_AMBULATORY_CARE_PROVIDER_SITE_OTHER): Payer: Self-pay | Admitting: Thoracic Surgery (Cardiothoracic Vascular Surgery)

## 2023-06-25 VITALS — BP 142/82 | HR 105 | Resp 18 | Ht 61.0 in | Wt 125.0 lb

## 2023-06-25 DIAGNOSIS — C3491 Malignant neoplasm of unspecified part of right bronchus or lung: Secondary | ICD-10-CM

## 2023-06-25 DIAGNOSIS — Z902 Acquired absence of lung [part of]: Secondary | ICD-10-CM

## 2023-06-25 NOTE — Progress Notes (Signed)
[address]       Kathleen Roman [address]             [phone]     HPI: Kathleen Roman returns for follow-up after recent right upper lobectomy  Kathleen Roman is a 62 year old non-smoker with a history of myalgias, myositis, back pain, and vitamin D deficiency.  She presented to her primary physician with a cough.  Chest x-ray showed a right upper lobe opacity.  It was persistent on follow-up and then a CT showed a 1.8 x 1.6 cm spiculated nodule in the superior aspect of the right upper lobe.  PET/CT showed the nodule was hypermetabolic.  There was no evidence of metastatic disease.  Clinical stage was 1A (T1, N0)  She underwent a robotic assisted right upper lobectomy on 06/07/2023.  Postoperative course was unremarkable and she went home on day 3.  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  She feels well.  She notices a difference in her respiratory capacity.  She is only taking extra strength Tylenol for pain.  She is on gabapentin 300 mg twice daily as well.  Is not using narcotics.  Past Medical History:  Diagnosis Date   Back pain    GERD (gastroesophageal reflux disease)    pt denies, no longer has issues with this   Headache(784.0)    Hx: of sinus headaches   Myalgia    Myositis    pt denies   Vitamin D deficiency disease     Current Outpatient Medications  Medication Sig Dispense Refill   acetaminophen (TYLENOL) 500 MG tablet Take 2 tablets (1,000 mg total) by mouth every 6 (six) hours. 30 tablet 0   calcium carbonate (TUMS - DOSED IN MG ELEMENTAL CALCIUM) 500 MG chewable tablet Chew 1 tablet by mouth daily as needed for indigestion or heartburn.     Cholecalciferol (VITAMIN D3) 25 MCG (1000 UT) CAPS Take 1 capsule by mouth daily.     EPINEPHrine 0.3 mg/0.3 mL IJ SOAJ injection Inject 0.3 mg into the muscle daily as needed (allergic reaction).     gabapentin (NEURONTIN) 300 MG capsule Take 1 capsule (300 mg total) by  mouth 2 (two) times daily. 60 capsule 1   traMADol (ULTRAM) 50 MG tablet Take 1 tablet (50 mg total) by mouth every 6 (six) hours as needed (mild pain). 28 tablet 0   No current facility-administered medications for this visit.    Physical Exam BP (!) 142/82   Pulse (!) 105   Resp 18   Ht 5\' 1"  (1.549 m)   Wt 125 lb (56.7 kg)   SpO2 97% Comment: RA  BMI 23.38 kg/m  62 year old woman in no acute distress Alert and oriented x 3 with no focal deficits Lungs slightly diminished at right base but otherwise clear Incisions well-healed Cardiac regular rate and rhythm No peripheral edema  Diagnostic Tests: CHEST - 2 VIEW   COMPARISON:  06/10/23   FINDINGS: Postsurgical changes are noted in the right lung. No pneumothorax is noted. Mild volume loss is noted consistent with the recent lobectomy. Left lung is clear. No bony abnormality is noted.   IMPRESSION: Status post lobectomy on the right with mild volume loss.     Electronically Signed   By: Alcide Clever M.D.   On: 06/25/2023 13:16 I personally reviewed the chest x-ray images.  Good post lobectomy appearance.  Impression: Kathleen Roman is a 62 year old non-smoker who presented to  her primary care physician with a cough.  Chest x-ray showed a right upper lobe opacity.  That persisted on follow-up film and led to a CT which showed a 1.8 cm spiculated nodule in the right upper lobe.  On PET/CT the nodule is hypermetabolic.  There was no evidence of metastatic disease.  She underwent a robotic assisted right upper lobectomy on 06/07/2023.  The nodule was a stage IB (T2a, N0) adenocarcinoma..  She is now about 3 weeks out from surgery.  Her exercise tolerance is a little limited which is to be expected.  She does have some pain but is not requiring any narcotics.  She asked about the duration of gabapentin.  I recommended that she stay on that for at least 3 months.  She is already about a month into that.  A month from now if she  is doing well from a pain standpoint she can drop down from twice a day to once a day.  She does not feel like she is ready to drive yet.  She may begin driving when she feels up to it.  Plan to return to work in 8 weeks.  She has stage Ib disease.  She does not need any adjuvant chemotherapy but might be a candidate for targeted therapy depending on results of molecular testing.  Needs a follow-up appointment with Dr. Arbutus Ped  Plan: Follow-up with Dr. Arbutus Ped Return in 6 weeks to check on progress  Loreli Slot, MD [facility] [phone]

## 2023-06-27 ENCOUNTER — Telehealth: Payer: Self-pay | Admitting: Internal Medicine

## 2023-06-27 NOTE — Telephone Encounter (Signed)
Patient aware of upcoming appointment times/dates 

## 2023-07-10 ENCOUNTER — Other Ambulatory Visit: Payer: Self-pay | Admitting: *Deleted

## 2023-07-10 DIAGNOSIS — C3490 Malignant neoplasm of unspecified part of unspecified bronchus or lung: Secondary | ICD-10-CM

## 2023-07-11 ENCOUNTER — Inpatient Hospital Stay: Payer: 59 | Attending: Internal Medicine | Admitting: Internal Medicine

## 2023-07-11 ENCOUNTER — Other Ambulatory Visit: Payer: Self-pay

## 2023-07-11 ENCOUNTER — Inpatient Hospital Stay: Payer: 59

## 2023-07-11 VITALS — BP 137/89 | HR 99 | Temp 98.2°F | Resp 17 | Ht 61.0 in | Wt 126.4 lb

## 2023-07-11 DIAGNOSIS — C3490 Malignant neoplasm of unspecified part of unspecified bronchus or lung: Secondary | ICD-10-CM

## 2023-07-11 DIAGNOSIS — Z85118 Personal history of other malignant neoplasm of bronchus and lung: Secondary | ICD-10-CM | POA: Insufficient documentation

## 2023-07-11 LAB — CBC WITH DIFFERENTIAL (CANCER CENTER ONLY)
Abs Immature Granulocytes: 0.01 10*3/uL (ref 0.00–0.07)
Basophils Absolute: 0 10*3/uL (ref 0.0–0.1)
Basophils Relative: 1 %
Eosinophils Absolute: 0.2 10*3/uL (ref 0.0–0.5)
Eosinophils Relative: 3 %
HCT: 35.9 % — ABNORMAL LOW (ref 36.0–46.0)
Hemoglobin: 12.1 g/dL (ref 12.0–15.0)
Immature Granulocytes: 0 %
Lymphocytes Relative: 34 %
Lymphs Abs: 2 10*3/uL (ref 0.7–4.0)
MCH: 30.3 pg (ref 26.0–34.0)
MCHC: 33.7 g/dL (ref 30.0–36.0)
MCV: 89.8 fL (ref 80.0–100.0)
Monocytes Absolute: 0.5 10*3/uL (ref 0.1–1.0)
Monocytes Relative: 8 %
Neutro Abs: 3.1 10*3/uL (ref 1.7–7.7)
Neutrophils Relative %: 54 %
Platelet Count: 173 10*3/uL (ref 150–400)
RBC: 4 MIL/uL (ref 3.87–5.11)
RDW: 12.5 % (ref 11.5–15.5)
WBC Count: 5.8 10*3/uL (ref 4.0–10.5)
nRBC: 0 % (ref 0.0–0.2)

## 2023-07-11 LAB — CMP (CANCER CENTER ONLY)
ALT: 17 U/L (ref 0–44)
AST: 14 U/L — ABNORMAL LOW (ref 15–41)
Albumin: 4.2 g/dL (ref 3.5–5.0)
Alkaline Phosphatase: 59 U/L (ref 38–126)
Anion gap: 6 (ref 5–15)
BUN: 8 mg/dL (ref 8–23)
CO2: 27 mmol/L (ref 22–32)
Calcium: 9.7 mg/dL (ref 8.9–10.3)
Chloride: 105 mmol/L (ref 98–111)
Creatinine: 0.7 mg/dL (ref 0.44–1.00)
GFR, Estimated: >60 mL/min (ref >60)
Glucose, Bld: 93 mg/dL (ref 70–99)
Potassium: 3.6 mmol/L (ref 3.5–5.1)
Sodium: 138 mmol/L (ref 135–145)
Total Bilirubin: 0.5 mg/dL (ref 0.3–1.2)
Total Protein: 7 g/dL (ref 6.5–8.1)

## 2023-07-11 NOTE — Progress Notes (Signed)
[facility] Telephone:([phone]) [phone]   Fax:(336) [phone]  OFFICE PROGRESS NOTE  Thana Ates, MD [address] [place] [address]  DIAGNOSIS: Stage IB (T2a, N0, M0) non-small cell lung cancer, adenocarcinoma diagnosed in June 2024.  PRIOR THERAPY: Status post right upper lobectomy with lymph node dissection under the care of Dr. Dorris Fetch on June 07, 2023 and the final pathology showed 2.1 cm adenocarcinoma with carcinoma focally involves the visceral pleura connective tissue with no evidence for lymphovascular invasion or involvement of the dissected lymph nodes.  CURRENT THERAPY: Observation  INTERVAL HISTORY: Kathleen Roman 62 y.o. female returns to the clinic today for follow-up visit accompanied by her husband.  The patient is feeling fine today with no concerning complaints except for the mild soreness on the right side of the chest as well as numbness.  She denied having any shortness of breath, cough or hemoptysis.  She has no nausea, vomiting, diarrhea or constipation.  She has no headache or visual changes.  She underwent right upper lobectomy with lymph node dissection under the care of Dr. Dorris Fetch on June 07, 2023 and recovering very well from her surgery.  She is here today for evaluation and discussion of her treatment options.  MEDICAL HISTORY: Past Medical History:  Diagnosis Date   Back pain    GERD (gastroesophageal reflux disease)    pt denies, no longer has issues with this   Headache(784.0)    Hx: of sinus headaches   Myalgia    Myositis    pt denies   Vitamin D deficiency disease     ALLERGIES:  is allergic to amoxicillin-pot clavulanate, darvocet [propoxyphene n-acetaminophen], hydrocodone-acetaminophen, naproxen, oxycodone, and peanuts [nuts].  MEDICATIONS:  Current Outpatient Medications  Medication Sig Dispense Refill   acetaminophen (TYLENOL) 500 MG tablet Take 2 tablets (1,000 mg total) by mouth every 6  (six) hours. 30 tablet 0   calcium carbonate (TUMS - DOSED IN MG ELEMENTAL CALCIUM) 500 MG chewable tablet Chew 1 tablet by mouth daily as needed for indigestion or heartburn.     Cholecalciferol (VITAMIN D3) 25 MCG (1000 UT) CAPS Take 1 capsule by mouth daily.     EPINEPHrine 0.3 mg/0.3 mL IJ SOAJ injection Inject 0.3 mg into the muscle daily as needed (allergic reaction).     gabapentin (NEURONTIN) 300 MG capsule Take 1 capsule (300 mg total) by mouth 2 (two) times daily. 60 capsule 1   traMADol (ULTRAM) 50 MG tablet Take 1 tablet (50 mg total) by mouth every 6 (six) hours as needed (mild pain). 28 tablet 0   No current facility-administered medications for this visit.    SURGICAL HISTORY:  Past Surgical History:  Procedure Laterality Date   CHOLECYSTECTOMY N/A 09/24/2013   Procedure: LAPAROSCOPIC CHOLECYSTECTOMY;  Surgeon: Shelly Rubenstein, MD;  Location: [facility] OR;  Service: General;  Laterality: N/A;   colonscopy     DILATION AND CURETTAGE OF UTERUS     EYE SURGERY Right    eye muscle "tightened"   WISDOM TOOTH EXTRACTION      REVIEW OF SYSTEMS:  Constitutional: negative Eyes: negative Ears, nose, mouth, throat, and face: negative Respiratory: positive for pleurisy/chest pain Cardiovascular: negative Gastrointestinal: negative Genitourinary:negative Integument/breast: negative Hematologic/lymphatic: negative Musculoskeletal:negative Neurological: negative Behavioral/Psych: negative Endocrine: negative Allergic/Immunologic: negative   PHYSICAL EXAMINATION: General appearance: alert, cooperative, and no distress Head: Normocephalic, without obvious abnormality, atraumatic Neck: no adenopathy, no JVD, supple, symmetrical, trachea midline, and thyroid not  enlarged, symmetric, no tenderness/mass/nodules Lymph nodes: Cervical, supraclavicular, and axillary nodes normal. Resp: clear to auscultation bilaterally Back: symmetric, no curvature. ROM normal. No CVA  tenderness. Cardio: regular rate and rhythm, S1, S2 normal, no murmur, click, rub or gallop GI: soft, non-tender; bowel sounds normal; no masses,  no organomegaly Extremities: extremities normal, atraumatic, no cyanosis or edema Neurologic: Alert and oriented X 3, normal strength and tone. Normal symmetric reflexes. Normal coordination and gait  ECOG PERFORMANCE STATUS: 1 - Symptomatic but completely ambulatory  Blood pressure 137/89, pulse 99, temperature 98.2 F (36.8 C), temperature source Oral, resp. rate 17, height 5\' 1"  (1.549 m), weight 126 lb 6.4 oz (57.3 kg), SpO2 100%.  LABORATORY DATA: Lab Results  Component Value Date   WBC 5.8 07/11/2023   HGB 12.1 07/11/2023   HCT 35.9 (L) 07/11/2023   MCV 89.8 07/11/2023   PLT 173 07/11/2023      Chemistry      Component Value Date/Time   NA 138 07/11/2023 1029   K 3.6 07/11/2023 1029   CL 105 07/11/2023 1029   CO2 27 07/11/2023 1029   BUN 8 07/11/2023 1029   CREATININE 0.70 07/11/2023 1029      Component Value Date/Time   CALCIUM 9.7 07/11/2023 1029   ALKPHOS 59 07/11/2023 1029   AST 14 (L) 07/11/2023 1029   ALT 17 07/11/2023 1029   BILITOT 0.5 07/11/2023 1029       RADIOGRAPHIC STUDIES: DG Chest 2 View  Result Date: 06/25/2023 CLINICAL DATA:  History of lung carcinoma with lobectomy 2 weeks ago, initial encounter EXAM: CHEST - 2 VIEW COMPARISON:  06/10/23 FINDINGS: Postsurgical changes are noted in the right lung. No pneumothorax is noted. Mild volume loss is noted consistent with the recent lobectomy. Left lung is clear. No bony abnormality is noted. IMPRESSION: Status post lobectomy on the right with mild volume loss. Electronically Signed   By: Alcide Clever M.D.   On: 06/25/2023 13:16    ASSESSMENT AND PLAN: This is a very pleasant 62 years old white female with Stage IB (T2a, N0, M0) non-small cell lung cancer, adenocarcinoma diagnosed in June 2024. She is status post right upper lobectomy with lymph node dissection  under the care of Dr. Dorris Fetch on June 07, 2023 and the final pathology showed 2.1 cm adenocarcinoma with carcinoma focally involves the visceral pleura connective tissue with no evidence for lymphovascular invasion or involvement of the dissected lymph nodes. The patient is recovering well from her surgery. I had a lengthy discussion with the patient and her husband about her condition.  I explained to the patient that there is no survival benefit for adjuvant systemic chemotherapy or radiation for patient with a stage Ib if the tumor size is less than 4.0 cm. I recommended for the patient to continue on observation with repeat CT scan of the chest in 6 months for restaging of her disease. The patient was advised to call immediately if she has any other concerning symptoms in the interval. The patient voices understanding of current disease status and treatment options and is in agreement with the current care plan.  All questions were answered. The patient knows to call the clinic with any problems, questions or concerns. We can certainly see the patient much sooner if necessary. The total time spent in the appointment was 30 minutes.  Disclaimer: This note was dictated with voice recognition software. Similar sounding words can inadvertently be transcribed and may not be corrected upon review.

## 2023-08-01 ENCOUNTER — Other Ambulatory Visit: Payer: Self-pay | Admitting: Thoracic Surgery (Cardiothoracic Vascular Surgery)

## 2023-08-01 DIAGNOSIS — Z902 Acquired absence of lung [part of]: Secondary | ICD-10-CM

## 2023-08-06 ENCOUNTER — Ambulatory Visit (INDEPENDENT_AMBULATORY_CARE_PROVIDER_SITE_OTHER): Payer: Self-pay | Admitting: Thoracic Surgery (Cardiothoracic Vascular Surgery)

## 2023-08-06 ENCOUNTER — Ambulatory Visit: Admission: RE | Admit: 2023-08-06 | Payer: 59 | Source: Ambulatory Visit

## 2023-08-06 VITALS — BP 136/90 | HR 95 | Resp 18 | Ht 61.0 in | Wt 123.0 lb

## 2023-08-06 DIAGNOSIS — Z902 Acquired absence of lung [part of]: Secondary | ICD-10-CM

## 2023-08-06 MED ORDER — BENZONATATE 200 MG PO CAPS: 200.0000 mg | ORAL_CAPSULE | Freq: Three times a day (TID) | ORAL | 2 refills | Status: DC | PRN

## 2023-08-06 NOTE — Progress Notes (Signed)
[address]       [place] [id number]             [phone]     HPI: Ms. Kathleen Roman returns for scheduled follow-up visit after right upper lobectomy.  Kathleen Roman is a 62 year old woman with a history of myalgias, myositis, back pain, vitamin D deficiency, and stage Ib adenocarcinoma of the lung.  Initially presented with a cough and chest x-ray showed an opacity.  Confirmed by CT.  Clinical stage by PET/CT was 1A.  She underwent a robotic assisted right upper lobectomy on 06/07/2023.  She went home on postoperative day #3.  Pathology showed a stage Ib (T2a, N0) adenocarcinoma of the lung.  I saw her in the office on 06/25/2023.  She was taking gabapentin twice a day at that time.  Was only using extreme Tylenol for pain.  She did not want to take any narcotics.  Currently primary complaint is a persistent dry cough.  Has been present since the operation.  Has pain associated with the coughing.  Past Medical History:  Diagnosis Date   Back pain    GERD (gastroesophageal reflux disease)    pt denies, no longer has issues with this   Headache(784.0)    Hx: of sinus headaches   Myalgia    Myositis    pt denies   Vitamin D deficiency disease     Current Outpatient Medications  Medication Sig Dispense Refill   acetaminophen (TYLENOL) 500 MG tablet Take 2 tablets (1,000 mg total) by mouth every 6 (six) hours. (Patient taking differently: Take 1,000 mg by mouth at bedtime as needed for moderate pain.) 30 tablet 0   benzonatate (TESSALON) 200 MG capsule Take 1 capsule (200 mg total) by mouth 3 (three) times daily as needed for cough. 21 capsule 2   Cholecalciferol (VITAMIN D3) 25 MCG (1000 UT) CAPS Take 1 capsule by mouth daily.     EPINEPHrine 0.3 mg/0.3 mL IJ SOAJ injection Inject 0.3 mg into the muscle daily as needed (allergic reaction).     No current facility-administered medications for this visit.    Physical Exam BP (!) 136/90 (BP Location: Right  Arm, Patient Position: Sitting)   Pulse 95   Resp 18   Ht 5\' 1"  (1.549 m)   Wt 123 lb (55.8 kg)   SpO2 100% Comment: RA  BMI 23.14 kg/m  62 year old woman in no acute distress Alert and oriented x 3 with no focal deficits Lungs clear and essentially equal bilaterally Incisions clean dry and intact  Diagnostic Tests: I personally reviewed the chest x-ray images.  Postoperative changes.  No effusions or infiltrates.  Impression: Kathleen Roman is a 62 year old woman with a history of myalgias, myositis, back pain, vitamin D deficiency, and stage Ib adenocarcinoma of the lung.  Initially presented with a cough and chest x-ray showed an opacity.  Confirmed by CT.  Clinical stage by PET/CT was 1A.  She underwent a robotic assisted right upper lobectomy on 06/07/2023.  She went home on postoperative day #3.  Pathology showed a stage Ib (T2a, N0) adenocarcinoma of the lung.  Stage Ib adenocarcinoma of the lung-she saw Dr. Arbutus Ped.  No adjuvant therapy is indicated.  She will have a CT scan in 6 months.  Status post right upper lobectomy-overall doing well but does still have a persistent cough and pain associated with that.  Recommended prednisone for the cough but she does not want to take that.  Will give her prescription for Tessalon 200 mg p.o. 3 times daily as needed, 21 tablets, 2 refills.  If that is ineffective then we really should do a course of prednisone.  Plan for her to return to work 08/26/2023  Plan: Follow-up with Dr. Arbutus Ped as scheduled I will plan to see her back in about 6 months after her CT scan. She knows to call if she has any issues in the meantime  Loreli Slot, MD [facility] [phone]

## 2023-08-08 ENCOUNTER — Other Ambulatory Visit: Payer: Self-pay | Admitting: Thoracic Surgery (Cardiothoracic Vascular Surgery)

## 2023-08-08 ENCOUNTER — Telehealth: Payer: Self-pay

## 2023-08-08 MED ORDER — PREDNISONE 10 MG PO TABS: 10.0000 mg | ORAL_TABLET | Freq: Every day | ORAL | 1 refills | Status: DC

## 2023-08-08 NOTE — Telephone Encounter (Signed)
-----   Message from Loreli Slot sent at 08/08/2023  1:59 PM EDT ----- Regarding: RE: requesting Prednisone I sent a script  [facility] ----- Message ----- From: Joycelyn Schmid, LPN Sent: 03/18/8118   1:36 PM EDT To: Loreli Slot, MD Subject: requesting Prednisone                          Ms Tito is calling C/O cont cough. The tessalon is not helping and would like a RX of Prednisone, if you agree please send to her [facility] noted in epic chart.  thanks

## 2023-08-08 NOTE — Progress Notes (Signed)
Prednisone 10 mg po daily x 21 days, 1 refill if needed  Viviann Spare C. Dorris Fetch, MD [facility] [phone]

## 2023-08-13 ENCOUNTER — Telehealth: Payer: Self-pay | Admitting: Internal Medicine

## 2023-08-13 NOTE — Telephone Encounter (Signed)
Patient is aware of scheduled appointment times/dates

## 2023-08-28 NOTE — Progress Notes (Signed)
I called the pt to notify her that her follow up CT has been scheduled for 12/30 at 10:00 at [facility] I let her know I have sent a scheduling message to have her lab appt from 1/6 be moved to 12/30 at 9:00. Pt has an active MyChart and I advised her to check her account and if the lab appt doesn't show up to let me know.

## 2023-10-04 ENCOUNTER — Other Ambulatory Visit: Payer: Self-pay | Admitting: Thoracic Surgery (Cardiothoracic Vascular Surgery)

## 2023-12-09 ENCOUNTER — Ambulatory Visit (HOSPITAL_COMMUNITY)
Admission: RE | Admit: 2023-12-09 | Discharge: 2023-12-09 | Disposition: A | Discharge status: Home / Self Care | Payer: 59 | Source: Ambulatory Visit | Attending: Internal Medicine | Admitting: Internal Medicine

## 2023-12-09 ENCOUNTER — Inpatient Hospital Stay: Payer: 59 | Attending: Internal Medicine

## 2023-12-09 ENCOUNTER — Other Ambulatory Visit: Payer: Self-pay | Admitting: Medical Oncology

## 2023-12-09 DIAGNOSIS — C3490 Malignant neoplasm of unspecified part of unspecified bronchus or lung: Secondary | ICD-10-CM

## 2023-12-09 DIAGNOSIS — Z85118 Personal history of other malignant neoplasm of bronchus and lung: Secondary | ICD-10-CM | POA: Insufficient documentation

## 2023-12-09 DIAGNOSIS — Z902 Acquired absence of lung [part of]: Secondary | ICD-10-CM | POA: Insufficient documentation

## 2023-12-09 LAB — CMP (CANCER CENTER ONLY)
ALT: 23 U/L (ref 0–44)
AST: 19 U/L (ref 15–41)
Albumin: 4.5 g/dL (ref 3.5–5.0)
Alkaline Phosphatase: 56 U/L (ref 38–126)
Anion gap: 7 (ref 5–15)
BUN: 9 mg/dL (ref 8–23)
CO2: 27 mmol/L (ref 22–32)
Calcium: 9.8 mg/dL (ref 8.9–10.3)
Chloride: 104 mmol/L (ref 98–111)
Creatinine: 0.77 mg/dL (ref 0.44–1.00)
GFR, Estimated: >60 mL/min (ref >60)
Glucose, Bld: 116 mg/dL — ABNORMAL HIGH (ref 70–99)
Potassium: 4.3 mmol/L (ref 3.5–5.1)
Sodium: 138 mmol/L (ref 135–145)
Total Bilirubin: 0.6 mg/dL (ref <1.2)
Total Protein: 7.4 g/dL (ref 6.5–8.1)

## 2023-12-09 LAB — CBC WITH DIFFERENTIAL (CANCER CENTER ONLY)
Abs Immature Granulocytes: 0.02 10*3/uL (ref 0.00–0.07)
Basophils Absolute: 0 10*3/uL (ref 0.0–0.1)
Basophils Relative: 0 %
Eosinophils Absolute: 0.1 10*3/uL (ref 0.0–0.5)
Eosinophils Relative: 2 %
HCT: 39.5 % (ref 36.0–46.0)
Hemoglobin: 13.4 g/dL (ref 12.0–15.0)
Immature Granulocytes: 0 %
Lymphocytes Relative: 26 %
Lymphs Abs: 1.7 10*3/uL (ref 0.7–4.0)
MCH: 31.2 pg (ref 26.0–34.0)
MCHC: 33.9 g/dL (ref 30.0–36.0)
MCV: 91.9 fL (ref 80.0–100.0)
Monocytes Absolute: 0.4 10*3/uL (ref 0.1–1.0)
Monocytes Relative: 6 %
Neutro Abs: 4.4 10*3/uL (ref 1.7–7.7)
Neutrophils Relative %: 66 %
Platelet Count: 195 10*3/uL (ref 150–400)
RBC: 4.3 MIL/uL (ref 3.87–5.11)
RDW: 12.3 % (ref 11.5–15.5)
WBC Count: 6.7 10*3/uL (ref 4.0–10.5)
nRBC: 0 % (ref 0.0–0.2)

## 2023-12-09 MED ORDER — IOHEXOL 300 MG/ML  SOLN
75.0000 mL | Freq: Once | INTRAMUSCULAR | Status: AC | PRN
  Administered 2023-12-09: 75 mL via INTRAVENOUS

## 2023-12-16 ENCOUNTER — Inpatient Hospital Stay: Payer: 59 | Attending: Internal Medicine

## 2023-12-16 ENCOUNTER — Inpatient Hospital Stay: Payer: 59 | Admitting: Internal Medicine

## 2023-12-16 VITALS — BP 139/77 | HR 101 | Temp 98.3°F | Resp 16 | Ht 61.0 in | Wt 130.5 lb

## 2023-12-16 DIAGNOSIS — C3490 Malignant neoplasm of unspecified part of unspecified bronchus or lung: Secondary | ICD-10-CM

## 2023-12-16 DIAGNOSIS — Z902 Acquired absence of lung [part of]: Secondary | ICD-10-CM | POA: Insufficient documentation

## 2023-12-16 DIAGNOSIS — K760 Fatty (change of) liver, not elsewhere classified: Secondary | ICD-10-CM | POA: Diagnosis not present

## 2023-12-16 DIAGNOSIS — Z85118 Personal history of other malignant neoplasm of bronchus and lung: Secondary | ICD-10-CM | POA: Insufficient documentation

## 2023-12-16 LAB — CBC WITH DIFFERENTIAL (CANCER CENTER ONLY)
Abs Immature Granulocytes: 0.01 10*3/uL (ref 0.00–0.07)
Basophils Absolute: 0 10*3/uL (ref 0.0–0.1)
Basophils Relative: 1 %
Eosinophils Absolute: 0.1 10*3/uL (ref 0.0–0.5)
Eosinophils Relative: 3 %
HCT: 38.2 % (ref 36.0–46.0)
Hemoglobin: 12.9 g/dL (ref 12.0–15.0)
Immature Granulocytes: 0 %
Lymphocytes Relative: 35 %
Lymphs Abs: 1.8 10*3/uL (ref 0.7–4.0)
MCH: 30.9 pg (ref 26.0–34.0)
MCHC: 33.8 g/dL (ref 30.0–36.0)
MCV: 91.6 fL (ref 80.0–100.0)
Monocytes Absolute: 0.4 10*3/uL (ref 0.1–1.0)
Monocytes Relative: 7 %
Neutro Abs: 2.8 10*3/uL (ref 1.7–7.7)
Neutrophils Relative %: 54 %
Platelet Count: 194 10*3/uL (ref 150–400)
RBC: 4.17 MIL/uL (ref 3.87–5.11)
RDW: 12.3 % (ref 11.5–15.5)
WBC Count: 5.1 10*3/uL (ref 4.0–10.5)
nRBC: 0 % (ref 0.0–0.2)

## 2023-12-16 LAB — CMP (CANCER CENTER ONLY)
ALT: 19 U/L (ref 0–44)
AST: 16 U/L (ref 15–41)
Albumin: 4.5 g/dL (ref 3.5–5.0)
Alkaline Phosphatase: 60 U/L (ref 38–126)
Anion gap: 4 — ABNORMAL LOW (ref 5–15)
BUN: 10 mg/dL (ref 8–23)
CO2: 29 mmol/L (ref 22–32)
Calcium: 9.7 mg/dL (ref 8.9–10.3)
Chloride: 105 mmol/L (ref 98–111)
Creatinine: 0.74 mg/dL (ref 0.44–1.00)
GFR, Estimated: >60 mL/min (ref >60)
Glucose, Bld: 110 mg/dL — ABNORMAL HIGH (ref 70–99)
Potassium: 3.8 mmol/L (ref 3.5–5.1)
Sodium: 138 mmol/L (ref 135–145)
Total Bilirubin: 0.6 mg/dL (ref 0.0–1.2)
Total Protein: 7.2 g/dL (ref 6.5–8.1)

## 2023-12-16 NOTE — Progress Notes (Signed)
 [facility] [phone]   Fax:([phone]) [phone]  OFFICE PROGRESS NOTE  Kathleen Trula SQUIBB, MD [address] [place] [address]  DIAGNOSIS: Stage IB (T2a, N0, M0) non-small cell lung cancer, adenocarcinoma diagnosed in June 2024.  PRIOR THERAPY: Status post right upper lobectomy with lymph node dissection under the care of Dr. Kerrin on June 07, 2023 and the final pathology showed 2.1 cm adenocarcinoma with carcinoma focally involves the visceral pleura connective tissue with no evidence for lymphovascular invasion or involvement of the dissected lymph nodes.  CURRENT THERAPY: Observation  INTERVAL HISTORY: Kathleen Roman 63 y.o. female returns to the clinic today for 63-month follow-up visit accompanied by her husband.Discussed the use of AI scribe software for clinical note transcription with the patient, who gave verbal consent to proceed.  History of Present Illness   Kathleen Roman, a 63 year old patient with a history of stage IB right lung adenocarcinoma, underwent a right upper lobectomy in 2024. The tumor, measuring 2.1 cm, was associated with the visceral pleura. Since the last consultation six months ago, the patient reports no significant health complaints. There have been no instances of chest pain or breathing difficulties.  The patient does report a persistent cough, which was discussed with the oncologist, Dr. Kerrin. It was suggested that the cough could be due to irritation from surgical staples. To manage this, a low dose of prednisone  (10mg ) was prescribed for a period of 21 days, which successfully alleviated the cough. However, occasional coughing persists, but it is not as frequent as before.  The patient denies any recent weight loss, night sweats, nausea, vomiting, or diarrhea. There is also a mention of occasional soreness during exercise. No changes in medication have been reported, except for the cessation of  prednisone  and Tessalon , which was initially prescribed for the cough but proved ineffective.       MEDICAL HISTORY: Past Medical History:  Diagnosis Date   Back pain    GERD (gastroesophageal reflux disease)    pt denies, no longer has issues with this   Headache(784.0)    Hx: of sinus headaches   Myalgia    Myositis    pt denies   Vitamin D deficiency disease     ALLERGIES:  is allergic to amoxicillin -pot clavulanate, darvocet [propoxyphene n-acetaminophen ], hydrocodone-acetaminophen , naproxen , oxycodone , and peanuts [nuts].  MEDICATIONS:  Current Outpatient Medications  Medication Sig Dispense Refill   acetaminophen  (TYLENOL ) 500 MG tablet Take 2 tablets (1,000 mg total) by mouth every 6 (six) hours. (Patient taking differently: Take 1,000 mg by mouth at bedtime as needed for moderate pain.) 30 tablet 0   benzonatate  (TESSALON ) 200 MG capsule Take 1 capsule (200 mg total) by mouth 3 (three) times daily as needed for cough. 21 capsule 2   Cholecalciferol (VITAMIN D3) 25 MCG (1000 UT) CAPS Take 1 capsule by mouth daily.     EPINEPHrine  0.3 mg/0.3 mL IJ SOAJ injection Inject 0.3 mg into the muscle daily as needed (allergic reaction).     predniSONE  (DELTASONE ) 10 MG tablet Take 1 tablet (10 mg total) by mouth daily with breakfast. 21 tablet 1   No current facility-administered medications for this visit.    SURGICAL HISTORY:  Past Surgical History:  Procedure Laterality Date   CHOLECYSTECTOMY N/A 09/24/2013   Procedure: LAPAROSCOPIC CHOLECYSTECTOMY;  Surgeon: Vicenta DELENA Poli, MD;  Location: [facility] OR;  Service: General;  Laterality: N/A;   colonscopy     DILATION AND CURETTAGE  OF UTERUS     EYE SURGERY Right    eye muscle tightened   WISDOM TOOTH EXTRACTION      REVIEW OF SYSTEMS:  A comprehensive review of systems was negative.   PHYSICAL EXAMINATION: General appearance: alert, cooperative, and no distress Head: Normocephalic, without obvious abnormality,  atraumatic Neck: no adenopathy, no JVD, supple, symmetrical, trachea midline, and thyroid not enlarged, symmetric, no tenderness/mass/nodules Lymph nodes: Cervical, supraclavicular, and axillary nodes normal. Resp: clear to auscultation bilaterally Back: symmetric, no curvature. ROM normal. No CVA tenderness. Cardio: regular rate and rhythm, S1, S2 normal, no murmur, click, rub or gallop GI: soft, non-tender; bowel sounds normal; no masses,  no organomegaly Extremities: extremities normal, atraumatic, no cyanosis or edema  ECOG PERFORMANCE STATUS: 1 - Symptomatic but completely ambulatory  Blood pressure 139/77, pulse (!) 101, temperature 98.3 F (36.8 C), resp. rate 16, height 5' 1 (1.549 m), weight 130 lb 8 oz (59.2 kg), SpO2 100%.  LABORATORY DATA: Lab Results  Component Value Date   WBC 6.7 12/09/2023   HGB 13.4 12/09/2023   HCT 39.5 12/09/2023   MCV 91.9 12/09/2023   PLT 195 12/09/2023      Chemistry      Component Value Date/Time   NA 138 12/09/2023 0928   K 4.3 12/09/2023 0928   CL 104 12/09/2023 0928   CO2 27 12/09/2023 0928   BUN 9 12/09/2023 0928   CREATININE 0.77 12/09/2023 0928      Component Value Date/Time   CALCIUM 9.8 12/09/2023 0928   ALKPHOS 56 12/09/2023 0928   AST 19 12/09/2023 0928   ALT 23 12/09/2023 0928   BILITOT 0.6 12/09/2023 0928       RADIOGRAPHIC STUDIES: CT Chest W Contrast Result Date: 12/16/2023 CLINICAL DATA:  Non-small cell lung cancer, staging. * Tracking Code: BO * EXAM: CT CHEST WITH CONTRAST TECHNIQUE: Multidetector CT imaging of the chest was performed during intravenous contrast administration. RADIATION DOSE REDUCTION: This exam was performed according to the departmental dose-optimization program which includes automated exposure control, adjustment of the mA and/or kV according to patient size and/or use of iterative reconstruction technique. CONTRAST:  75mL OMNIPAQUE  IOHEXOL  300 MG/ML  SOLN COMPARISON:  CT 04/18/2023.  PET-CT  05/23/2023. FINDINGS: Cardiovascular: No acute vascular findings. Minimal coronary artery atherosclerosis. The heart size is normal. There is no pericardial effusion. Mediastinum/Nodes: There are no enlarged mediastinal, hilar or axillary lymph nodes. The thyroid gland, trachea and esophagus demonstrate no significant findings. Lungs/Pleura: Interval right upper lobectomy. No pleural effusion or pneumothorax. There are unchanged small ground-glass opacities in the left upper lobe measuring up to 3 mm on image 25/5. No solid, new or enlarging nodules are identified. Stable minimal left apical scarring. Upper abdomen: Mild hepatic steatosis. Stable small cyst in the dome of the right hepatic lobe. No suspicious findings. Musculoskeletal/Chest wall: There is no chest wall mass or suspicious osseous finding. IMPRESSION: 1. Interval right upper lobectomy. No evidence of local recurrence or metastatic disease. 2. Stable small ground-glass opacities in the left upper lobe. Recommend attention on routine follow-up. 3. Mild hepatic steatosis. Electronically Signed   By: Elsie Perone M.D.   On: 12/16/2023 09:53    ASSESSMENT AND PLAN: This is a very pleasant 63 years old white female with Stage IB (T2a, N0, M0) non-small cell lung cancer, adenocarcinoma diagnosed in June 2024. She is status post right upper lobectomy with lymph node dissection under the care of Dr. Kerrin on June 07, 2023 and the final pathology  showed 2.1 cm adenocarcinoma with carcinoma focally involves the visceral pleura connective tissue with no evidence for lymphovascular invasion or involvement of the dissected lymph nodes. The patient is currently on observation and she is feeling fine with no concerning complaints. She had repeat CT scan of the chest performed recently.  Her scan showed no evidence for disease recurrence or metastasis.    Stage IB (T2a, N0, M0) Adenocarcinoma of the Lung Status post right upper lobectomy for a 2.1 cm  adenocarcinoma involving the visceral pleura. No evidence of local recurrence or metastatic disease on recent scans. Reports occasional cough, previously managed with prednisone  (5 mg for 21 days) which resolved. No current chest pain, dyspnea, weight loss, night sweats, nausea, vomiting, or diarrhea. Blood count is normal; awaiting chemistry results. Ground glass opacity in the left upper lobe remains well-managed with no new concerning findings. - Regular scans - Follow-up in six months  Hepatic Steatosis Mild hepatic steatosis noted. No significant symptoms reported. Emphasis on diet and cholesterol control as primary management. 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 Encouraged to pursue this interest. - Provide contact information for volunteer coordinator Katie Danner  Follow-up - Schedule follow-up appointment in six months.   The patient was advised to call immediately if she has any other concerning symptoms in the interval. The patient voices understanding of current disease status and treatment options and is in agreement with the current care plan.  All questions were answered. The patient knows to call the clinic with any problems, questions or concerns. We can certainly see the patient much sooner if necessary. The total time spent in the appointment was 20 minutes.  Disclaimer: This note was dictated with voice recognition software. Similar sounding words can inadvertently be transcribed and may not be corrected upon review.

## 2023-12-17 ENCOUNTER — Ambulatory Visit: Payer: 59 | Admitting: Thoracic Surgery (Cardiothoracic Vascular Surgery)

## 2023-12-17 VITALS — BP 123/78 | HR 99 | Resp 20 | Ht 61.0 in | Wt 130.0 lb

## 2023-12-17 DIAGNOSIS — C3491 Malignant neoplasm of unspecified part of right bronchus or lung: Secondary | ICD-10-CM | POA: Diagnosis not present

## 2023-12-17 DIAGNOSIS — Z902 Acquired absence of lung [part of]: Secondary | ICD-10-CM | POA: Diagnosis not present

## 2023-12-17 NOTE — Progress Notes (Signed)
      [address]       [place] [address]             [phone]      HPI: Kathleen Roman returns for scheduled follow-up visit after previous lobectomy for stage Ib lung cancer.  Kathleen Roman is a 63 year old woman with a history of myalgias, myositis, back pain, vitamin D deficiency, and stage Ib adenocarcinoma of the lung.  She initially presented with a cough.  Initially found to have a right upper lobe lung nodule after presenting with a cough.  She underwent robotic assisted right upper lobectomy on 05/18/2023.  Pathology showed a stage Ib (T2a, N0) adenocarcinoma.  She saw Dr. Gatha.  She did not require adjuvant therapy.  She is doing well.  She had a persistent cough initially.  That finally resolved after 3-week course of prednisone .  Still has occasional cough and occasional twinges of pain but is not taking any pain medication.  Past Medical History:  Diagnosis Date   Back pain    GERD (gastroesophageal reflux disease)    pt denies, no longer has issues with this   Headache(784.0)    Hx: of sinus headaches   Myalgia    Myositis    pt denies   Vitamin D deficiency disease     Current Outpatient Medications  Medication Sig Dispense Refill   acetaminophen  (TYLENOL ) 500 MG tablet Take 2 tablets (1,000 mg total) by mouth every 6 (six) hours. (Patient taking differently: Take 1,000 mg by mouth at bedtime as needed for moderate pain (pain score 4-6).) 30 tablet 0   Cholecalciferol (VITAMIN D3) 25 MCG (1000 UT) CAPS Take 1 capsule by mouth daily.     EPINEPHrine  0.3 mg/0.3 mL IJ SOAJ injection Inject 0.3 mg into the muscle daily as needed (allergic reaction).     No current facility-administered medications for this visit.    Physical Exam BP 123/78   Pulse 99   Resp 20   Ht 5' 1 (1.549 m)   Wt 130 lb (59 kg)   SpO2 96% Comment: RA  BMI 24.26 kg/m  63 year old woman in no acute distress Alert and oriented x 3 with no focal deficits Lungs  slightly diminished at right base but otherwise clear Cardiac regular rate and rhythm No cervical or supraclavicular adenopathy  Diagnostic Tests: I reviewed her CT from 12/09/2023.  Postoperative changes.  Small (3 mm) nodules left upper lobe.  No evidence of recurrent disease.  Impression: Kathleen Roman is a 63 year old woman with a history of myalgias, myositis, back pain, vitamin D deficiency, and stage Ib adenocarcinoma of the lung.  Stage Ib adenocarcinoma the lung-status post right upper lobectomy.  Doing well with no evidence of recurrent disease.  Needs continued semiannual follow-up.  She will do that with Dr. Sherrod.  Status post right upper lobectomy-doing well.  No significant pain issues.  Cough resolved.  Plan: Follow-up with Dr. Sherrod I will be happy to see Kathleen Roman back anytime in the future if I can be of any further assistance with her care  Kathleen JAYSON Millers, MD [facility] [phone]

## 2023-12-17 NOTE — Addendum Note (Signed)
 Addended by: Charma Igo on: 12/17/2023 09:53 AM   Modules accepted: Orders

## 2024-05-03 ENCOUNTER — Other Ambulatory Visit: Payer: Self-pay | Admitting: Physician Assistant

## 2024-05-03 ENCOUNTER — Other Ambulatory Visit: Payer: Self-pay | Admitting: Thoracic Surgery (Cardiothoracic Vascular Surgery)

## 2024-06-08 ENCOUNTER — Inpatient Hospital Stay: Payer: 59 | Attending: Internal Medicine

## 2024-06-08 ENCOUNTER — Ambulatory Visit (HOSPITAL_COMMUNITY)
Admission: RE | Admit: 2024-06-08 | Discharge: 2024-06-08 | Disposition: A | Discharge status: Home / Self Care | Source: Ambulatory Visit | Attending: Internal Medicine | Admitting: Internal Medicine

## 2024-06-08 DIAGNOSIS — I700 Atherosclerosis of aorta: Secondary | ICD-10-CM | POA: Diagnosis not present

## 2024-06-08 DIAGNOSIS — C3490 Malignant neoplasm of unspecified part of unspecified bronchus or lung: Secondary | ICD-10-CM | POA: Diagnosis not present

## 2024-06-08 DIAGNOSIS — C3411 Malignant neoplasm of upper lobe, right bronchus or lung: Secondary | ICD-10-CM | POA: Insufficient documentation

## 2024-06-08 LAB — CBC WITH DIFFERENTIAL (CANCER CENTER ONLY)
Abs Immature Granulocytes: 0 10*3/uL (ref 0.00–0.07)
Basophils Absolute: 0 10*3/uL (ref 0.0–0.1)
Basophils Relative: 0 %
Eosinophils Absolute: 0 10*3/uL (ref 0.0–0.5)
Eosinophils Relative: 1 %
HCT: 40 % (ref 36.0–46.0)
Hemoglobin: 13.7 g/dL (ref 12.0–15.0)
Immature Granulocytes: 0 %
Lymphocytes Relative: 30 %
Lymphs Abs: 1.4 10*3/uL (ref 0.7–4.0)
MCH: 30.9 pg (ref 26.0–34.0)
MCHC: 34.3 g/dL (ref 30.0–36.0)
MCV: 90.1 fL (ref 80.0–100.0)
Monocytes Absolute: 0.7 10*3/uL (ref 0.1–1.0)
Monocytes Relative: 16 %
Neutro Abs: 2.4 10*3/uL (ref 1.7–7.7)
Neutrophils Relative %: 53 %
Platelet Count: 151 10*3/uL (ref 150–400)
RBC: 4.44 MIL/uL (ref 3.87–5.11)
RDW: 12.5 % (ref 11.5–15.5)
WBC Count: 4.6 10*3/uL (ref 4.0–10.5)
nRBC: 0 % (ref 0.0–0.2)

## 2024-06-08 LAB — CMP (CANCER CENTER ONLY)
ALT: 31 U/L (ref 0–44)
AST: 26 U/L (ref 15–41)
Albumin: 4.6 g/dL (ref 3.5–5.0)
Alkaline Phosphatase: 61 U/L (ref 38–126)
Anion gap: 9 (ref 5–15)
BUN: 10 mg/dL (ref 8–23)
CO2: 28 mmol/L (ref 22–32)
Calcium: 9.4 mg/dL (ref 8.9–10.3)
Chloride: 99 mmol/L (ref 98–111)
Creatinine: 0.8 mg/dL (ref 0.44–1.00)
GFR, Estimated: >60 mL/min (ref >60)
Glucose, Bld: 97 mg/dL (ref 70–99)
Potassium: 3.4 mmol/L — ABNORMAL LOW (ref 3.5–5.1)
Sodium: 136 mmol/L (ref 135–145)
Total Bilirubin: 0.6 mg/dL (ref 0.0–1.2)
Total Protein: 7.7 g/dL (ref 6.5–8.1)

## 2024-06-08 MED ORDER — IOHEXOL 300 MG/ML  SOLN
75.0000 mL | Freq: Once | INTRAMUSCULAR | Status: AC | PRN
  Administered 2024-06-08: 75 mL via INTRAVENOUS

## 2024-06-08 MED ORDER — SODIUM CHLORIDE (PF) 0.9 % IJ SOLN
INTRAMUSCULAR | Status: AC
  Filled 2024-06-08: qty 50

## 2024-06-13 NOTE — Progress Notes (Unsigned)
  [facility] OFFICE PROGRESS NOTE  Dwight Trula SQUIBB, MD [address] [place] [address]  DIAGNOSIS: Stage IB (T2a, N0, M0) non-small cell lung cancer, adenocarcinoma diagnosed in June 2024.   PRIOR THERAPY:Status post right upper lobectomy with lymph node dissection under the care of Dr. Kerrin on June 07, 2023 and the final pathology showed 2.1 cm adenocarcinoma with carcinoma focally involves the visceral pleura connective tissue with no evidence for lymphovascular invasion or involvement of the dissected lymph nodes.   CURRENT THERAPY: Observation   INTERVAL HISTORY: Kathleen Roman 63 y.o. female returns to the clinic today for a follow up visit. The patient was last seen in the clinic on 12/16/23. The patient has a history of stage IB lung cancer s/p right upper lobectomy in 2024. The tumor, measuring 2.1 cm, was associated with the visceral pleura. She denies any major changes in her health since she was last seen. At her last appointment she was endorsing a cough which has ***.  Denies any fever, chills, night sweats, or weight loss. Denies any chest pain, shortness of breath, cough, or hemoptysis. Denies any nausea, vomiting, diarrhea, or constipation. Denies any headache or visual changes. Denies any rashes or skin changes. She recently had a restaging CT scan performed. The patient is here today for evaluation and to review her scan results.     MEDICAL HISTORY: Past Medical History:  Diagnosis Date   Back pain    GERD (gastroesophageal reflux disease)    pt denies, no longer has issues with this   Headache(784.0)    Hx: of sinus headaches   Myalgia    Myositis    pt denies   Vitamin D deficiency disease     ALLERGIES:  is allergic to amoxicillin -pot clavulanate, darvocet [propoxyphene n-acetaminophen ], hydrocodone-acetaminophen , naproxen , oxycodone , and peanuts [nuts].  MEDICATIONS:  Current Outpatient Medications  Medication Sig  Dispense Refill   acetaminophen  (TYLENOL ) 500 MG tablet Take 2 tablets (1,000 mg total) by mouth every 6 (six) hours. (Patient taking differently: Take 1,000 mg by mouth at bedtime as needed for moderate pain (pain score 4-6).) 30 tablet 0   Cholecalciferol (VITAMIN D3) 25 MCG (1000 UT) CAPS Take 1 capsule by mouth daily.     EPINEPHrine  0.3 mg/0.3 mL IJ SOAJ injection Inject 0.3 mg into the muscle daily as needed (allergic reaction).     No current facility-administered medications for this visit.    SURGICAL HISTORY:  Past Surgical History:  Procedure Laterality Date   CHOLECYSTECTOMY N/A 09/24/2013   Procedure: LAPAROSCOPIC CHOLECYSTECTOMY;  Surgeon: Vicenta DELENA Poli, MD;  Location: [facility] OR;  Service: General;  Laterality: N/A;   colonscopy     DILATION AND CURETTAGE OF UTERUS     EYE SURGERY Right    eye muscle tightened   WISDOM TOOTH EXTRACTION      REVIEW OF SYSTEMS:   Review of Systems  Constitutional: Negative for appetite change, chills, fatigue, fever and unexpected weight change.  HENT:   Negative for mouth sores, nosebleeds, sore throat and trouble swallowing.   Eyes: Negative for eye problems and icterus.  Respiratory: Negative for cough, hemoptysis, shortness of breath and wheezing.   Cardiovascular: Negative for chest pain and leg swelling.  Gastrointestinal: Negative for abdominal pain, constipation, diarrhea, nausea and vomiting.  Genitourinary: Negative for bladder incontinence, difficulty urinating, dysuria, frequency and hematuria.   Musculoskeletal: Negative for back pain, gait problem, neck pain and neck stiffness.  Skin: Negative for itching  and rash.  Neurological: Negative for dizziness, extremity weakness, gait problem, headaches, light-headedness and seizures.  Hematological: Negative for adenopathy. Does not bruise/bleed easily.  Psychiatric/Behavioral: Negative for confusion, depression and sleep disturbance. The patient is not nervous/anxious.      PHYSICAL EXAMINATION:  There were no vitals taken for this visit.  ECOG PERFORMANCE STATUS: {CHL ONC ECOG [id number]  Physical Exam  Constitutional: Oriented to person, place, and time and well-developed, well-nourished, and in no distress. No distress.  HENT:  Head: Normocephalic and atraumatic.  Mouth/Throat: Oropharynx is clear and moist. No oropharyngeal exudate.  Eyes: Conjunctivae are normal. Right eye exhibits no discharge. Left eye exhibits no discharge. No scleral icterus.  Neck: Normal range of motion. Neck supple.  Cardiovascular: Normal rate, regular rhythm, normal heart sounds and intact distal pulses.   Pulmonary/Chest: Effort normal and breath sounds normal. No respiratory distress. No wheezes. No rales.  Abdominal: Soft. Bowel sounds are normal. Exhibits no distension and no mass. There is no tenderness.  Musculoskeletal: Normal range of motion. Exhibits no edema.  Lymphadenopathy:    No cervical adenopathy.  Neurological: Alert and oriented to person, place, and time. Exhibits normal muscle tone. Gait normal. Coordination normal.  Skin: Skin is warm and dry. No rash noted. Not diaphoretic. No erythema. No pallor.  Psychiatric: Mood, memory and judgment normal.  Vitals reviewed.  LABORATORY DATA: Lab Results  Component Value Date   WBC 4.6 06/08/2024   HGB 13.7 06/08/2024   HCT 40.0 06/08/2024   MCV 90.1 06/08/2024   PLT 151 06/08/2024      Chemistry      Component Value Date/Time   NA 136 06/08/2024 0928   K 3.4 (L) 06/08/2024 0928   CL 99 06/08/2024 0928   CO2 28 06/08/2024 0928   BUN 10 06/08/2024 0928   CREATININE 0.80 06/08/2024 0928      Component Value Date/Time   CALCIUM 9.4 06/08/2024 0928   ALKPHOS 61 06/08/2024 0928   AST 26 06/08/2024 0928   ALT 31 06/08/2024 0928   BILITOT 0.6 06/08/2024 0928       RADIOGRAPHIC STUDIES:  CT Chest W Contrast Result Date: 06/08/2024 CLINICAL DATA:  Non-small cell lung cancer (NSCLC),  staging. * Tracking Code: BO * EXAM: CT CHEST WITH CONTRAST TECHNIQUE: Multidetector CT imaging of the chest was performed during intravenous contrast administration. RADIATION DOSE REDUCTION: This exam was performed according to the departmental dose-optimization program which includes automated exposure control, adjustment of the mA and/or kV according to patient size and/or use of iterative reconstruction technique. CONTRAST:  75mL OMNIPAQUE  IOHEXOL  300 MG/ML  SOLN COMPARISON:  CT scan chest from 12/09/2023. FINDINGS: Cardiovascular: Normal cardiac size. No pericardial effusion. No aortic aneurysm. There are coronary artery calcifications, in keeping with coronary artery disease. Mediastinum/Nodes: Visualized thyroid gland appears grossly unremarkable. No solid / cystic mediastinal masses. The esophagus is nondistended precluding optimal assessment. There are few mildly prominent mediastinal lymph nodes, which do not meet the size criteria for lymphadenopathy and appear grossly similar to the prior study, favoring benign etiology. No axillary or hilar lymphadenopathy by size criteria. Lungs/Pleura: The central tracheo-bronchial tree is patent. Note is made of right upper lobe lobectomy. There is resultant focal scarring in the right lung. No mass or consolidation. No pleural effusion or pneumothorax. There are multiple (at least 4) 5 mm or smaller ground-glass opacities in the left lung upper lobe (marked with electronic arrow sign on series 5), which are unchanged since the prior study. No new  or suspicious lung nodules. Upper Abdomen: There is a stable approximately 1.0 x 1.1 cm cyst in the right hepatic lobe. Surgically absent gallbladder. Remaining visualized upper abdominal viscera within normal limits. Musculoskeletal: The visualized soft tissues of the chest wall are grossly unremarkable. No suspicious osseous lesions. There are mild multilevel degenerative changes in the visualized spine. IMPRESSION: 1.  No metastatic disease identified within the chest. 2. Multiple other nonacute observations, as described above. Aortic Atherosclerosis (ICD10-I70.0). Electronically Signed   By: Ree Molt M.D.   On: 06/08/2024 12:42     ASSESSMENT/PLAN:  This is a very pleasant 63 year old Caucasian female with  Stage IB (T2a, N0, M0) non-small cell lung cancer, adenocarcinoma diagnosed in June 2024. She is status post right upper lobectomy with lymph node dissection under the care of Dr. Kerrin on June 07, 2023 and the final pathology showed 2.1 cm adenocarcinoma with carcinoma focally involves the visceral pleura connective tissue with no evidence for lymphovascular invasion or involvement of the dissected lymph nodes.  She is currently on observation and feeling well.   She recently had a restaging CT scan performed.   Her scan did not show any evidence of disease progression.   She will continue on observation with a restaging CT scan in 6 months.   The patient was advised to call immediately if she has any concerning symptoms in the interval. The patient voices understanding of current disease status and treatment options and is in agreement with the current care plan. All questions were answered. The patient knows to call the clinic with any problems, questions or concerns. We can certainly see the patient much sooner if necessary      No orders of the defined types were placed in this encounter.    I spent {CHL ONC TIME VISIT - DTPQU:[id number]} counseling the patient face to face. The total time spent in the appointment was {CHL ONC TIME VISIT - DTPQU:[id number]}.  Mary Secord L Jayjay Littles, PA-C 06/13/24

## 2024-06-15 ENCOUNTER — Inpatient Hospital Stay: Attending: Physician Assistant | Admitting: Physician Assistant

## 2024-06-15 ENCOUNTER — Inpatient Hospital Stay: Payer: 59 | Admitting: Internal Medicine

## 2024-06-15 VITALS — BP 137/80 | HR 94 | Temp 97.5°F | Resp 20 | Wt 130.1 lb

## 2024-06-15 DIAGNOSIS — Z902 Acquired absence of lung [part of]: Secondary | ICD-10-CM | POA: Insufficient documentation

## 2024-06-15 DIAGNOSIS — C3490 Malignant neoplasm of unspecified part of unspecified bronchus or lung: Secondary | ICD-10-CM | POA: Diagnosis not present

## 2024-06-15 DIAGNOSIS — Z85118 Personal history of other malignant neoplasm of bronchus and lung: Secondary | ICD-10-CM | POA: Diagnosis not present

## 2024-06-19 ENCOUNTER — Telehealth: Payer: Self-pay | Admitting: Internal Medicine

## 2024-06-19 NOTE — Telephone Encounter (Signed)
Scheduled appointments with the patient

## 2024-08-06 DIAGNOSIS — H40012 Open angle with borderline findings, low risk, left eye: Secondary | ICD-10-CM | POA: Diagnosis not present

## 2024-08-06 DIAGNOSIS — H524 Presbyopia: Secondary | ICD-10-CM | POA: Diagnosis not present

## 2024-11-09 DIAGNOSIS — J3089 Other allergic rhinitis: Secondary | ICD-10-CM | POA: Diagnosis not present

## 2024-11-09 DIAGNOSIS — Z91018 Allergy to other foods: Secondary | ICD-10-CM | POA: Diagnosis not present

## 2024-11-10 DIAGNOSIS — D225 Melanocytic nevi of trunk: Secondary | ICD-10-CM | POA: Diagnosis not present

## 2024-11-10 DIAGNOSIS — L814 Other melanin hyperpigmentation: Secondary | ICD-10-CM | POA: Diagnosis not present

## 2024-11-10 DIAGNOSIS — L821 Other seborrheic keratosis: Secondary | ICD-10-CM | POA: Diagnosis not present

## 2024-11-10 DIAGNOSIS — L578 Other skin changes due to chronic exposure to nonionizing radiation: Secondary | ICD-10-CM | POA: Diagnosis not present

## 2024-12-16 ENCOUNTER — Ambulatory Visit (HOSPITAL_COMMUNITY)
Admission: RE | Admit: 2024-12-16 | Discharge: 2024-12-16 | Disposition: A | Discharge status: Home / Self Care | Source: Ambulatory Visit | Attending: Physician Assistant | Admitting: Physician Assistant

## 2024-12-16 DIAGNOSIS — C3490 Malignant neoplasm of unspecified part of unspecified bronchus or lung: Secondary | ICD-10-CM | POA: Diagnosis present

## 2024-12-16 MED ORDER — IOHEXOL 300 MG/ML  SOLN
75.0000 mL | Freq: Once | INTRAMUSCULAR | Status: AC | PRN
  Administered 2024-12-16: 75 mL via INTRAVENOUS

## 2024-12-17 NOTE — Progress Notes (Unsigned)
 [facility] OFFICE PROGRESS NOTE  Kathleen Trula SQUIBB, MD [address] [place] [address]  DIAGNOSIS: Stage IB (T2a, N0, M0) non-small cell lung cancer, adenocarcinoma diagnosed in June 2024.   PRIOR THERAPY: Status post right upper lobectomy with lymph node dissection under the care of Dr. Kerrin on June 07, 2023 and the final pathology showed 2.1 cm adenocarcinoma with carcinoma focally involves the visceral pleura connective tissue with no evidence for lymphovascular invasion or involvement of the dissected lymph nodes.   CURRENT THERAPY: Observation   INTERVAL HISTORY: Kathleen Roman 64 y.o. female returns to the clinic today for a follow up visit accompanied by her husband. The patient was last seen in the clinic on 06/15/24. The patient has a history of stage IB lung cancer s/p right upper lobectomy in 2024. The tumor, measuring 2.1 cm, was associated with the visceral pleura. She denies any major changes in her health since she was last seen.  Denies any fever, chills, night sweats, or weight loss. She is active with pilates and strength training. Denies any chest pain, shortness of breath, cough, or hemoptysis. Denies any nausea, vomiting, diarrhea, or constipation. Denies any headache or visual changes. Denies any rashes or skin changes. She recently had a restaging CT scan performed. The patient is here today for evaluation and to review her scan results.    MEDICAL HISTORY: Past Medical History:  Diagnosis Date   Back pain    GERD (gastroesophageal reflux disease)    pt denies, no longer has issues with this   Headache(784.0)    Hx: of sinus headaches   Myalgia    Myositis    pt denies   Vitamin D deficiency disease     ALLERGIES:  is allergic to amoxicillin -pot clavulanate, darvocet [propoxyphene n-acetaminophen ], hydrocodone-acetaminophen , naproxen , oxycodone , and peanuts [nuts].  MEDICATIONS:  Current Outpatient Medications  Medication  Sig Dispense Refill   acetaminophen  (TYLENOL ) 500 MG tablet Take 500 mg by mouth every 6 (six) hours as needed (1 -2 PO PRN).     Cholecalciferol (VITAMIN D3) 25 MCG (1000 UT) CAPS Take 1 capsule by mouth daily.     EPINEPHrine  0.3 mg/0.3 mL IJ SOAJ injection Inject 0.3 mg into the muscle daily as needed (allergic reaction).     acetaminophen  (TYLENOL ) 500 MG tablet Take 2 tablets (1,000 mg total) by mouth every 6 (six) hours. (Patient taking differently: Take 1,000 mg by mouth at bedtime as needed for moderate pain (pain score 4-6).) 30 tablet 0   No current facility-administered medications for this visit.    SURGICAL HISTORY:  Past Surgical History:  Procedure Laterality Date   CHOLECYSTECTOMY N/A 09/24/2013   Procedure: LAPAROSCOPIC CHOLECYSTECTOMY;  Surgeon: Vicenta DELENA Poli, MD;  Location: [facility] OR;  Service: General;  Laterality: N/A;   colonscopy     DILATION AND CURETTAGE OF UTERUS     EYE SURGERY Right    eye muscle tightened   WISDOM TOOTH EXTRACTION      REVIEW OF SYSTEMS:   Review of Systems  Constitutional: Negative for appetite change, chills, fatigue, fever and unexpected weight change.  HENT:   Negative for mouth sores, nosebleeds, sore throat and trouble swallowing.   Eyes: Negative for eye problems and icterus.  Respiratory: Negative for cough, hemoptysis, shortness of breath and wheezing.   Cardiovascular: Negative for chest pain and leg swelling.  Gastrointestinal: Negative for abdominal pain, constipation, diarrhea, nausea and vomiting.  Genitourinary: Negative for bladder incontinence, difficulty  urinating, dysuria, frequency and hematuria.   Musculoskeletal: Negative for back pain, gait problem, neck pain and neck stiffness.  Skin: Negative for itching and rash.  Neurological: Negative for dizziness, extremity weakness, gait problem, headaches, light-headedness and seizures.  Hematological: Negative for adenopathy. Does not bruise/bleed easily.   Psychiatric/Behavioral: Negative for confusion, depression and sleep disturbance. The patient is not nervous/anxious.     PHYSICAL EXAMINATION:  Blood pressure 137/73, pulse 94, temperature 97.8 F (36.6 C), temperature source Temporal, resp. rate 17, height 5' 1 (1.549 m), weight 132 lb (59.9 kg), SpO2 100%.  ECOG PERFORMANCE STATUS: 1  Physical Exam  Constitutional: Oriented to person, place, and time and well-developed, well-nourished, and in no distress.  HENT:  Head: Normocephalic and atraumatic.  Mouth/Throat: Oropharynx is clear and moist. No oropharyngeal exudate.  Eyes: Conjunctivae are normal. Right eye exhibits no discharge. Left eye exhibits no discharge. No scleral icterus.  Neck: Normal range of motion. Neck supple.  Cardiovascular: Normal rate, regular rhythm, normal heart sounds and intact distal pulses.   Pulmonary/Chest: Effort normal and breath sounds normal. No respiratory distress. No wheezes. No rales.  Abdominal: Soft. Bowel sounds are normal. Exhibits no distension and no mass. There is no tenderness.  Musculoskeletal: Normal range of motion. Exhibits no edema.  Lymphadenopathy:    No cervical adenopathy.  Neurological: Alert and oriented to person, place, and time. Exhibits normal muscle tone. Gait normal. Coordination normal.  Skin: Skin is warm and dry. No rash noted. Not diaphoretic. No erythema. No pallor.  Psychiatric: Mood, memory and judgment normal.  Vitals reviewed.  LABORATORY DATA: Lab Results  Component Value Date   WBC 5.0 12/21/2024   HGB 13.1 12/21/2024   HCT 38.5 12/21/2024   MCV 91.7 12/21/2024   PLT 178 12/21/2024      Chemistry      Component Value Date/Time   NA 137 12/21/2024 0922   K 4.2 12/21/2024 0922   CL 103 12/21/2024 0922   CO2 25 12/21/2024 0922   BUN 7 (L) 12/21/2024 0922   CREATININE 0.72 12/21/2024 0922      Component Value Date/Time   CALCIUM 9.4 12/21/2024 0922   ALKPHOS 72 12/21/2024 0922   AST 22  12/21/2024 0922   ALT 25 12/21/2024 0922   BILITOT 0.5 12/21/2024 9077       RADIOGRAPHIC STUDIES:  CT Chest W Contrast Result Date: 12/18/2024 CLINICAL DATA:  Adenocarcinoma status post right upper lobectomy. Surveillance. * Tracking Code: BO * EXAM: CT CHEST WITH CONTRAST TECHNIQUE: Multidetector CT imaging of the chest was performed during intravenous contrast administration. RADIATION DOSE REDUCTION: This exam was performed according to the departmental dose-optimization program which includes automated exposure control, adjustment of the mA and/or kV according to patient size and/or use of iterative reconstruction technique. CONTRAST:  75mL OMNIPAQUE  IOHEXOL  300 MG/ML  SOLN COMPARISON:  CT chest dated 06/08/2024 FINDINGS: Cardiovascular: Normal heart size. No significant pericardial fluid/thickening. Great vessels are normal in course and caliber. No central pulmonary emboli. Coronary artery calcifications. Mediastinum/Nodes: Imaged thyroid gland without nodules meeting criteria for imaging follow-up by size. Normal esophagus. No pathologically enlarged axillary, supraclavicular, mediastinal, or hilar lymph nodes. Lungs/Pleura: The central airways are patent. 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 Postsurgical changes of right upper lobectomy. Mild bilateral lower lobe bronchiectasis. Scattered subsegmental mucous plugging. Unchanged left apical ground-glass nodules measuring up to 4 mm (8:31, 38, 43). Unchanged solid left lower lobe nodules measuring 3 mm (8:104, 123). No suspicious new or  enlarging pulmonary nodules. No pneumothorax. No pleural effusion. Upper abdomen: Cholecystectomy. 11 mm hepatic segment 4/8 cyst (2:123), unchanged. Musculoskeletal: No acute or abnormal lytic or blastic osseous lesions. Multilevel degenerative changes of the thoracic spine. IMPRESSION: 1. Postsurgical changes of right upper lobectomy. No evidence of local recurrence or metastatic  disease in the chest. 2. Unchanged left apical ground-glass nodules measuring up to 4 mm and solid left lower lobe nodules measuring 3 mm. No suspicious new or enlarging pulmonary nodules. 3. Coronary artery calcifications. Assessment for potential risk factor modification, dietary therapy or pharmacologic therapy may be warranted, if clinically indicated. Electronically Signed   By: Limin  Xu M.D.   On: 12/18/2024 15:46     ASSESSMENT/PLAN:  This is a very pleasant 64 year old Caucasian female with  Stage IB (T2a, N0, M0) non-small cell lung cancer, adenocarcinoma diagnosed in June 2024. She is status post right upper lobectomy with lymph node dissection under the care of Dr. Kerrin on June 07, 2023 and the final pathology showed 2.1 cm adenocarcinoma with carcinoma focally involves the visceral pleura connective tissue with no evidence for lymphovascular invasion or involvement of the dissected lymph nodes.   She is currently on observation and feeling well.    She recently had a restaging CT scan performed.    The patient was seen with Dr. Sherrod today.  Dr. Sherrod personally and independently reviewed the scan and discussed results with the patient today.  The scan showed no disease progression.  Dr. Sherrod recommends she continue on observation. Dr. Sherrod offered follow up in 1 year. She is interested in this.   We will see her 1 week after the scan to review the results.   The patient was advised to call immediately if she has any concerning symptoms in the interval. The patient voices understanding of current disease status and treatment options and is in agreement with the current care plan. All questions were answered. The patient knows to call the clinic with any problems, questions or concerns. We can certainly see the patient much sooner if necessary    Orders Placed This Encounter  Procedures   CT Chest W Contrast    Standing Status:   Future    Expected Date:    12/09/2025    Expiration Date:   03/09/2026    If indicated for the ordered procedure, I authorize the administration of contrast media per Radiology protocol:   Yes    Does the patient have a contrast media/X-ray dye allergy?:   No    Preferred imaging location?:   [facility]   CBC with Differential (Cancer Center Only)    Standing Status:   Future    Expected Date:   06/20/2025    Expiration Date:   12/21/2025   CMP (Cancer Center only)    Standing Status:   Future    Expected Date:   06/20/2025    Expiration Date:   12/21/2025      Kathleen Hams L Pamila Mendibles, PA-C 12/21/2024  ADDENDUM: Hematology/Oncology Attending: I had a face-to-face encounter with the patient today.  I reviewed her records, lab, scan and recommended her care plan.  This is a very pleasant 64 years old female with a stage Ib non-small cell lung cancer, adenocarcinoma status post right upper lobectomy with lymph node dissection and he has been in observation since June 2024.  The patient had repeat CT scan of the chest performed recently.  I personally independently reviewed the scan and discussed  the result with the patient and her husband.  Her scan showed no concerning findings for disease recurrence or metastasis. I recommended for her to continue on observation with repeat CT scan of the chest in 1 year. The patient was advised to call immediately if she has any other concerning symptoms in the interval. Disclaimer: This note was dictated with voice recognition software. Similar sounding words can inadvertently be transcribed and may be missed upon review. Sherrod MARLA Sherrod, MD

## 2024-12-21 ENCOUNTER — Inpatient Hospital Stay: Attending: Physician Assistant

## 2024-12-21 ENCOUNTER — Inpatient Hospital Stay: Admitting: Physician Assistant

## 2024-12-21 VITALS — BP 137/73 | HR 94 | Temp 97.8°F | Resp 17 | Ht 61.0 in | Wt 132.0 lb

## 2024-12-21 DIAGNOSIS — Z902 Acquired absence of lung [part of]: Secondary | ICD-10-CM | POA: Diagnosis not present

## 2024-12-21 DIAGNOSIS — C3490 Malignant neoplasm of unspecified part of unspecified bronchus or lung: Secondary | ICD-10-CM | POA: Diagnosis not present

## 2024-12-21 DIAGNOSIS — Z85118 Personal history of other malignant neoplasm of bronchus and lung: Secondary | ICD-10-CM | POA: Insufficient documentation

## 2024-12-21 LAB — CBC WITH DIFFERENTIAL (CANCER CENTER ONLY)
Abs Immature Granulocytes: 0.01 K/uL (ref 0.00–0.07)
Basophils Absolute: 0 K/uL (ref 0.0–0.1)
Basophils Relative: 1 %
Eosinophils Absolute: 0.1 K/uL (ref 0.0–0.5)
Eosinophils Relative: 2 %
HCT: 38.5 % (ref 36.0–46.0)
Hemoglobin: 13.1 g/dL (ref 12.0–15.0)
Immature Granulocytes: 0 %
Lymphocytes Relative: 38 %
Lymphs Abs: 1.9 K/uL (ref 0.7–4.0)
MCH: 31.2 pg (ref 26.0–34.0)
MCHC: 34 g/dL (ref 30.0–36.0)
MCV: 91.7 fL (ref 80.0–100.0)
Monocytes Absolute: 0.3 K/uL (ref 0.1–1.0)
Monocytes Relative: 6 %
Neutro Abs: 2.7 K/uL (ref 1.7–7.7)
Neutrophils Relative %: 53 %
Platelet Count: 178 K/uL (ref 150–400)
RBC: 4.2 MIL/uL (ref 3.87–5.11)
RDW: 12 % (ref 11.5–15.5)
WBC Count: 5 K/uL (ref 4.0–10.5)
nRBC: 0 % (ref 0.0–0.2)

## 2024-12-21 LAB — CMP (CANCER CENTER ONLY)
ALT: 25 U/L (ref 0–44)
AST: 22 U/L (ref 15–41)
Albumin: 4.5 g/dL (ref 3.5–5.0)
Alkaline Phosphatase: 72 U/L (ref 38–126)
Anion gap: 9 (ref 5–15)
BUN: 7 mg/dL — ABNORMAL LOW (ref 8–23)
CO2: 25 mmol/L (ref 22–32)
Calcium: 9.4 mg/dL (ref 8.9–10.3)
Chloride: 103 mmol/L (ref 98–111)
Creatinine: 0.72 mg/dL (ref 0.44–1.00)
GFR, Estimated: >60 mL/min
Glucose, Bld: 109 mg/dL — ABNORMAL HIGH (ref 70–99)
Potassium: 4.2 mmol/L (ref 3.5–5.1)
Sodium: 137 mmol/L (ref 135–145)
Total Bilirubin: 0.5 mg/dL (ref 0.0–1.2)
Total Protein: 7.4 g/dL (ref 6.5–8.1)
